# Patient Record
Sex: Male | Born: 1990 | Race: Black or African American | Hispanic: No | Marital: Single | Smoking: Never smoker
Health system: Southern US, Community
[De-identification: ages and names within clinical notes are randomized; demographics above are authoritative.]

## PROBLEM LIST (undated history)

## (undated) DIAGNOSIS — I10 Essential (primary) hypertension: Secondary | ICD-10-CM

## (undated) DIAGNOSIS — U071 COVID-19: Secondary | ICD-10-CM

---
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
## 2019-04-16 ENCOUNTER — Encounter (HOSPITAL_COMMUNITY): Payer: Self-pay | Admitting: Emergency Medicine

## 2019-04-16 ENCOUNTER — Emergency Department (HOSPITAL_COMMUNITY): Payer: Self-pay

## 2019-04-16 ENCOUNTER — Other Ambulatory Visit: Payer: Self-pay

## 2019-04-16 ENCOUNTER — Emergency Department (HOSPITAL_COMMUNITY)
Admission: EM | Admit: 2019-04-16 | Discharge: 2019-04-17 | Disposition: A | Discharge status: Home / Self Care | Payer: Self-pay | Attending: Emergency Medicine | Admitting: Emergency Medicine

## 2019-04-16 DIAGNOSIS — Z20822 Contact with and (suspected) exposure to covid-19: Secondary | ICD-10-CM

## 2019-04-16 DIAGNOSIS — U071 COVID-19: Secondary | ICD-10-CM | POA: Insufficient documentation

## 2019-04-16 LAB — CBC WITH DIFFERENTIAL/PLATELET
Abs Immature Granulocytes: 0.04 10*3/uL (ref 0.00–0.07)
Basophils Absolute: 0 10*3/uL (ref 0.0–0.1)
Basophils Relative: 0 %
Eosinophils Absolute: 0 10*3/uL (ref 0.0–0.5)
Eosinophils Relative: 0 %
HCT: 47.9 % (ref 39.0–52.0)
Hemoglobin: 16 g/dL (ref 13.0–17.0)
Immature Granulocytes: 1 %
Lymphocytes Relative: 9 %
Lymphs Abs: 0.8 10*3/uL (ref 0.7–4.0)
MCH: 31.3 pg (ref 26.0–34.0)
MCHC: 33.4 g/dL (ref 30.0–36.0)
MCV: 93.6 fL (ref 80.0–100.0)
Monocytes Absolute: 0.2 10*3/uL (ref 0.1–1.0)
Monocytes Relative: 2 %
Neutro Abs: 7.5 10*3/uL (ref 1.7–7.7)
Neutrophils Relative %: 88 %
Platelets: 173 10*3/uL (ref 150–400)
RBC: 5.12 MIL/uL (ref 4.22–5.81)
RDW: 12.8 % (ref 11.5–15.5)
WBC: 8.5 10*3/uL (ref 4.0–10.5)
nRBC: 0 % (ref 0.0–0.2)

## 2019-04-16 LAB — COMPREHENSIVE METABOLIC PANEL
ALT: 46 U/L — ABNORMAL HIGH (ref 0–44)
AST: 49 U/L — ABNORMAL HIGH (ref 15–41)
Albumin: 3.2 g/dL — ABNORMAL LOW (ref 3.5–5.0)
Alkaline Phosphatase: 61 U/L (ref 38–126)
Anion gap: 10 (ref 5–15)
BUN: 10 mg/dL (ref 6–20)
CO2: 22 mmol/L (ref 22–32)
Calcium: 8.6 mg/dL — ABNORMAL LOW (ref 8.9–10.3)
Chloride: 103 mmol/L (ref 98–111)
Creatinine, Ser: 1.46 mg/dL — ABNORMAL HIGH (ref 0.61–1.24)
GFR calc Af Amer: >60 mL/min (ref >60)
GFR calc non Af Amer: >60 mL/min (ref >60)
Glucose, Bld: 116 mg/dL — ABNORMAL HIGH (ref 70–99)
Potassium: 4 mmol/L (ref 3.5–5.1)
Sodium: 135 mmol/L (ref 135–145)
Total Bilirubin: 0.5 mg/dL (ref 0.3–1.2)
Total Protein: 7.3 g/dL (ref 6.5–8.1)

## 2019-04-16 LAB — URINALYSIS, ROUTINE W REFLEX MICROSCOPIC
Bacteria, UA: NONE SEEN
Bilirubin Urine: NEGATIVE
Glucose, UA: NEGATIVE mg/dL
Hgb urine dipstick: NEGATIVE
Ketones, ur: NEGATIVE mg/dL
Leukocytes,Ua: NEGATIVE
Nitrite: NEGATIVE
Protein, ur: >=300 mg/dL — AB
Specific Gravity, Urine: 1.04 — ABNORMAL HIGH (ref 1.005–1.030)
pH: 5 (ref 5.0–8.0)

## 2019-04-16 LAB — LIPASE, BLOOD: Lipase: 27 U/L (ref 11–51)

## 2019-04-16 LAB — LACTIC ACID, PLASMA: Lactic Acid, Venous: 1.1 mmol/L (ref 0.5–1.9)

## 2019-04-16 MED ORDER — SODIUM CHLORIDE 0.9% FLUSH: 3.0000 mL | Freq: Once | INTRAVENOUS | Status: DC

## 2019-04-16 MED ORDER — ALBUTEROL SULFATE HFA 108 (90 BASE) MCG/ACT IN AERS
2.0000 | INHALATION_SPRAY | Freq: Once | RESPIRATORY_TRACT | Status: AC
  Administered 2019-04-16: 2 via RESPIRATORY_TRACT
  Filled 2019-04-16: qty 6.7

## 2019-04-16 MED ORDER — ONDANSETRON HCL 4 MG/2ML IJ SOLN
4.0000 mg | Freq: Once | INTRAMUSCULAR | Status: AC
  Administered 2019-04-16: 4 mg via INTRAVENOUS
  Filled 2019-04-16: qty 2

## 2019-04-16 MED ORDER — ACETAMINOPHEN 325 MG PO TABS: 650.0000 mg | ORAL_TABLET | Freq: Once | ORAL | Status: DC | PRN

## 2019-04-16 MED ORDER — SODIUM CHLORIDE 0.9 % IV BOLUS
1000.0000 mL | Freq: Once | INTRAVENOUS | Status: AC
  Administered 2019-04-16: 20:00:00 1000 mL via INTRAVENOUS

## 2019-04-16 MED ORDER — ACETAMINOPHEN 500 MG PO TABS
1000.0000 mg | ORAL_TABLET | Freq: Once | ORAL | Status: AC
  Administered 2019-04-16: 1000 mg via ORAL
  Filled 2019-04-16: qty 2

## 2019-04-16 NOTE — ED Triage Notes (Addendum)
C/o fever, SOB, chest pain, non-productive cough, headache, and vomiting x 5 days.  Denies known exposure to COVID.  Temp 102.7 pt to treatment room.

## 2019-04-16 NOTE — ED Provider Notes (Signed)
[facility] EMER[facility]ENCY DEPARTMENT Provider Note   CSN: [id number] Arrival date & time: 04/16/19  1833     History   Chief Complaint Chief Complaint  Patient presents with  . Fever  . Cough  . Emesis    HPI Craig Silva is a 28 y.o. male.     HPI   28 year old male with history of hypertension presenting to the ED for evaluation of fever, rhinorrhea, nonproductive cough, nausea, vomiting, diarrhea, headache that began about 5 days ago.  He also reports chest wall pain with cough only.  He has had some dyspnea on exertion but no shortness of breath at rest.  Denies pain with inspiration.  Denies abdominal pain.  He states he was recently around his neighbor who was diagnosed with the coronavirus last week.  History reviewed. No pertinent past medical history.  There are no active problems to display for this patient.   History reviewed. No pertinent surgical history.      Home Medications    Prior to Admission medications   Medication Sig Start Date End Date Taking? Authorizing Provider  diphenhydramine-acetaminophen (TYLENOL PM EXTRA STRENGTH) 25-500 MG TABS tablet Take 2-3 tablets by mouth at bedtime as needed (pain/fever/cough/sleep).   Yes [provider]  DM-Doxylamine-Acetaminophen (NIGHTTIME COLD/FLU RELIEF PO) Take 2 tablets by mouth at bedtime as needed (cough/congestion/fever).   Yes [provider]  Pseudoephedrine-APAP-DM (DAYTIME COLD/FLU PO) Take 1-2 tablets by mouth every 6 (six) hours as needed (cough/congestion/fever).   Yes [provider]  benzonatate (TESSALON) 100 MG capsule Take 1 capsule (100 mg total) by mouth every 8 (eight) hours for 5 days. 04/17/19 04/22/19  Vane Yapp S, PA-C  ondansetron (ZOFRAN) 4 MG tablet Take 1 tablet (4 mg total) by mouth every 6 (six) hours. 04/17/19   Chanon Loney S, PA-C    Family History No family history on file.  Social History Social History   Tobacco  Use  . Smoking status: Never Smoker  . Smokeless tobacco: Never Used  Substance Use Topics  . Alcohol use: Not Currently  . Drug use: Never     Allergies   Patient has no known allergies.   Review of Systems Review of Systems  Constitutional: Positive for chills, diaphoresis and fever.  HENT: Positive for rhinorrhea. Negative for ear pain and sore throat.   Eyes: Negative for visual disturbance.  Respiratory: Positive for cough. Negative for shortness of breath.   Cardiovascular: Positive for chest pain (chest wall pain with cough only).  Gastrointestinal: Positive for diarrhea, nausea and vomiting. Negative for abdominal pain.  Genitourinary: Negative for dysuria and hematuria.  Musculoskeletal: Negative for back pain.  Skin: Negative for rash.  Neurological: Positive for headaches.  All other systems reviewed and are negative.    Physical Exam Updated Vital Signs BP 125/84   Pulse (!) 101   Temp (!) 102.7 F (39.3 C) (Oral)   Resp 19   SpO2 94%   Physical Exam Vitals signs and nursing note reviewed.  Constitutional:      Appearance: He is well-developed. He is diaphoretic.  HENT:     Head: Normocephalic and atraumatic.     Mouth/Throat:     Mouth: Mucous membranes are dry.     Pharynx: No oropharyngeal exudate or posterior oropharyngeal erythema.  Eyes:     Conjunctiva/sclera: Conjunctivae normal.  Neck:     Musculoskeletal: Neck supple.  Cardiovascular:     Rate and Rhythm: Regular rhythm. Tachycardia present.  Heart sounds: No murmur.  Pulmonary:     Effort: Pulmonary effort is normal. No respiratory distress.     Breath sounds: Normal breath sounds. No wheezing, rhonchi or rales.  Abdominal:     General: Bowel sounds are normal. There is no distension.     Palpations: Abdomen is soft.     Tenderness: There is no abdominal tenderness. There is no guarding or rebound.  Skin:    General: Skin is warm.  Neurological:     Mental Status: He is alert.       ED Treatments / Results  Labs (all labs ordered are listed, but only abnormal results are displayed) Labs Reviewed  COMPREHENSIVE METABOLIC PANEL - Abnormal; Notable for the following components:      Result Value   Glucose, Bld 116 (*)    Creatinine, Ser 1.46 (*)    Calcium 8.6 (*)    Albumin 3.2 (*)    AST 49 (*)    ALT 46 (*)    All other components within normal limits  URINALYSIS, ROUTINE W REFLEX MICROSCOPIC - Abnormal; Notable for the following components:   Color, Urine AMBER (*)    APPearance HAZY (*)    Specific Gravity, Urine 1.040 (*)    Protein, ur >=300 (*)    All other components within normal limits  SARS CORONAVIRUS 2 (TAT 6-24 HRS)  LACTIC ACID, PLASMA  CBC WITH DIFFERENTIAL/PLATELET  LIPASE, BLOOD  LACTIC ACID, PLASMA    EKG EKG Interpretation  Date/Time:  Saturday April 16 2019 18:37:05 EST Ventricular Rate:  124 PR Interval:  122 QRS Duration: 84 QT Interval:  314 QTC Calculation: 451 R Axis:   147 Text Interpretation: Sinus tachycardia Right axis deviation Possible Right ventricular hypertrophy ST & T wave abnormality, consider inferior ischemia Abnormal ECG No previous ECGs available Confirmed by Kennis CarinaBero, Michael [phone]([id number]) on 04/16/2019 8:30:47 PM   Radiology Dg Chest Portable 1 View  Result Date: 04/16/2019 CLINICAL DATA:  Shortness of breath and fever EXAM: PORTABLE CHEST 1 VIEW COMPARISON:  None. FINDINGS: Cardiac shadow is at the upper limits of normal in size but accentuated by the portable technique. The lungs are well aerated bilaterally. No focal infiltrate or sizable effusion is seen. No bony abnormality is noted. IMPRESSION: No active disease. Electronically Signed   By: Alcide CleverMark  Lukens M.D.   On: 04/16/2019 19:25    Procedures Procedures (including critical care time)  Medications Ordered in ED Medications  sodium chloride flush (NS) 0.9 % injection 3 mL (has no administration in time range)  acetaminophen (TYLENOL) tablet 650  mg (has no administration in time range)  acetaminophen (TYLENOL) tablet 1,000 mg (1,000 mg Oral Given 04/16/19 1932)  sodium chloride 0.9 % bolus 1,000 mL (1,000 mLs Intravenous New Bag/Given 04/16/19 1930)  ondansetron (ZOFRAN) injection 4 mg (4 mg Intravenous Given 04/16/19 1934)  albuterol (VENTOLIN HFA) 108 (90 Base) MCG/ACT inhaler 2 puff (2 puffs Inhalation Given 04/16/19 1933)     Initial Impression / Assessment and Plan / ED Course  I have reviewed the triage vital signs and the nursing notes.  Pertinent labs & imaging results that were available during my care of the patient were reviewed by me and considered in my medical decision making (see chart for details).     Final Clinical Impressions(s) / ED Diagnoses   Final diagnoses:  Suspected COVID-19 virus infection   28 year old male with history of hypertension presenting to the ED for evaluation of fever, rhinorrhea, nonproductive cough, nausea,  vomiting, diarrhea, headache that began about 5 days ago.  He also reports chest wall pain with cough only.  He has had some dyspnea on exertion but no shortness of breath at rest.  Denies pain with inspiration.  Denies abdominal pain.  He states he was recently around his neighbor who was diagnosed with the coronavirus last week.  CBC wnl CMP w/o significant electryolyte derangement, cr slightly elevated, no recent prior to compare, may be prerenal, hydrated in ED. AST/ALT slightly elevated, would expect with covid infection Lipase wnl UA with proteinuria, no UTI Lactic acid wnl COVID pending at discharge, high suspicion, advised to quarantine.   EKG Sinus tachycardia Right axis deviation Possible Right ventricular hypertrophy ST & T wave abnormality, consider inferior ischemia Abnormal ECG No previous ECGs available - no cp, doubt acs  CXR  No active disease  Pt give IVF ,antiemetics, tylenol. On reassessment patient states he feels improved after fluids and the inhaler.  His  heart rate has improved.  He satting at 96 to 98% on room air.  He does not feel short of breath.  He denies any chest pain or pleuritic pain.  I discussed work-up and plan for follow-up.  Covid test still pending but I do have high suspicion for this.  Advised on quarantine measures.  Given information follow-up with PCP.  Advised on return precautions.  He voices understanding of the plan and reasons to return.  All questions answered.  Patient here for discharge. ---  Craig Silva was evaluated in Emergency Department on 04/17/2019 for the symptoms described in the history of present illness. He was evaluated in the context of the global COVID-19 pandemic, which necessitated consideration that the patient might be at risk for infection with the SARS-CoV-2 virus that causes COVID-19. Institutional protocols and algorithms that pertain to the evaluation of patients at risk for COVID-19 are in a state of rapid change based on information released by regulatory bodies including the CDC and federal and state organizations. These policies and algorithms were followed during the patient's care in the ED.   ED Discharge Orders         Ordered    benzonatate (TESSALON) 100 MG capsule  Every 8 hours     04/17/19 0007    ondansetron (ZOFRAN) 4 MG tablet  Every 6 hours     04/17/19 0007           Karrie Meres, PA-C 04/17/19 0010    Sabas Sous, MD 04/17/19 [phone]

## 2019-04-17 LAB — SARS CORONAVIRUS 2 (TAT 6-24 HRS): SARS Coronavirus 2: POSITIVE — AB

## 2019-04-17 MED ORDER — ONDANSETRON HCL 4 MG PO TABS: 4.0000 mg | ORAL_TABLET | Freq: Four times a day (QID) | ORAL | 0 refills | Status: DC

## 2019-04-17 MED ORDER — BENZONATATE 100 MG PO CAPS: 100.0000 mg | ORAL_CAPSULE | Freq: Three times a day (TID) | ORAL | 0 refills | Status: AC

## 2019-04-17 NOTE — Discharge Instructions (Signed)
Take two puffs of the albuterol inhaler every 4 hours as needed for cough, shortness of breath, or wheezing.   Today you were were tested for the coronavirus.  The results will be available in the next 3 to 5 days.  If the results are positive the hospital will contact you.  If they are negative the hospital would not contact you.  You will need to self quarantine until you are aware of your results.  If they are positive you will need to self quarantine as directed below.  You should be isolated for at least 7 days since the onset of your symptoms AND >72 hours after symptoms resolution (absence of fever without the use of fever reducing medication and improvement in respiratory symptoms), whichever is longer

## 2019-04-17 NOTE — ED Notes (Signed)
Patient verbalizes understanding of discharge instructions. Opportunity for questioning and answers were provided. Armband removed by staff, pt discharged from ED.  

## 2019-05-10 ENCOUNTER — Encounter (HOSPITAL_COMMUNITY): Payer: Self-pay | Admitting: Emergency Medicine

## 2019-05-10 ENCOUNTER — Emergency Department (HOSPITAL_COMMUNITY)
Admission: EM | Admit: 2019-05-10 | Discharge: 2019-05-11 | Disposition: A | Discharge status: Home / Self Care | Payer: Self-pay | Attending: Emergency Medicine | Admitting: Emergency Medicine

## 2019-05-10 ENCOUNTER — Other Ambulatory Visit: Payer: Self-pay

## 2019-05-10 DIAGNOSIS — Z5321 Procedure and treatment not carried out due to patient leaving prior to being seen by health care provider: Secondary | ICD-10-CM | POA: Insufficient documentation

## 2019-05-10 DIAGNOSIS — I10 Essential (primary) hypertension: Secondary | ICD-10-CM | POA: Insufficient documentation

## 2019-05-10 HISTORY — DX: Essential (primary) hypertension: I10

## 2019-05-10 HISTORY — DX: COVID-19: U07.1

## 2019-05-10 LAB — COMPREHENSIVE METABOLIC PANEL
ALT: 66 U/L — ABNORMAL HIGH (ref 0–44)
AST: 60 U/L — ABNORMAL HIGH (ref 15–41)
Albumin: 3.8 g/dL (ref 3.5–5.0)
Alkaline Phosphatase: 67 U/L (ref 38–126)
Anion gap: 15 (ref 5–15)
BUN: 6 mg/dL (ref 6–20)
CO2: 23 mmol/L (ref 22–32)
Calcium: 9.2 mg/dL (ref 8.9–10.3)
Chloride: 98 mmol/L (ref 98–111)
Creatinine, Ser: 1.07 mg/dL (ref 0.61–1.24)
GFR calc Af Amer: >60 mL/min (ref >60)
GFR calc non Af Amer: >60 mL/min (ref >60)
Glucose, Bld: 144 mg/dL — ABNORMAL HIGH (ref 70–99)
Potassium: 3.7 mmol/L (ref 3.5–5.1)
Sodium: 136 mmol/L (ref 135–145)
Total Bilirubin: 1 mg/dL (ref 0.3–1.2)
Total Protein: 8.5 g/dL — ABNORMAL HIGH (ref 6.5–8.1)

## 2019-05-10 LAB — URINALYSIS, ROUTINE W REFLEX MICROSCOPIC
Bacteria, UA: NONE SEEN
Bilirubin Urine: NEGATIVE
Glucose, UA: 50 mg/dL — AB
Hgb urine dipstick: NEGATIVE
Ketones, ur: 20 mg/dL — AB
Leukocytes,Ua: NEGATIVE
Nitrite: NEGATIVE
Protein, ur: 100 mg/dL — AB
Specific Gravity, Urine: 1.027 (ref 1.005–1.030)
pH: 7 (ref 5.0–8.0)

## 2019-05-10 LAB — CBC WITH DIFFERENTIAL/PLATELET
Abs Immature Granulocytes: 0.02 10*3/uL (ref 0.00–0.07)
Basophils Absolute: 0 10*3/uL (ref 0.0–0.1)
Basophils Relative: 1 %
Eosinophils Absolute: 0 10*3/uL (ref 0.0–0.5)
Eosinophils Relative: 1 %
HCT: 48.8 % (ref 39.0–52.0)
Hemoglobin: 15.9 g/dL (ref 13.0–17.0)
Immature Granulocytes: 0 %
Lymphocytes Relative: 23 %
Lymphs Abs: 1.4 10*3/uL (ref 0.7–4.0)
MCH: 30.8 pg (ref 26.0–34.0)
MCHC: 32.6 g/dL (ref 30.0–36.0)
MCV: 94.6 fL (ref 80.0–100.0)
Monocytes Absolute: 0.5 10*3/uL (ref 0.1–1.0)
Monocytes Relative: 7 %
Neutro Abs: 4.3 10*3/uL (ref 1.7–7.7)
Neutrophils Relative %: 68 %
Platelets: 216 10*3/uL (ref 150–400)
RBC: 5.16 MIL/uL (ref 4.22–5.81)
RDW: 13.7 % (ref 11.5–15.5)
WBC: 6.3 10*3/uL (ref 4.0–10.5)
nRBC: 0 % (ref 0.0–0.2)

## 2019-05-10 NOTE — ED Triage Notes (Signed)
Pt to ED via EMS with c/o hypertension.  Pt st's he was laying down and started tingling all over.  Pt st's he has been dx with HTN but does not have a MD to follow up with.  Pt is not on any HTN meds at this time

## 2019-05-11 NOTE — ED Notes (Signed)
Charge/triage rn notified about his bp. States he should be next to go back

## 2019-05-11 NOTE — ED Notes (Signed)
Another patient states this patient got up and walked out. Not visible in lobby and not answering to name call for vital recheck.

## 2019-05-27 ENCOUNTER — Encounter (HOSPITAL_COMMUNITY): Payer: Self-pay | Admitting: Emergency Medicine

## 2019-05-27 ENCOUNTER — Observation Stay (HOSPITAL_COMMUNITY)
Admission: EM | Admit: 2019-05-27 | Discharge: 2019-05-28 | Disposition: A | Discharge status: Home / Self Care | Payer: Self-pay | Attending: Internal Medicine | Admitting: Internal Medicine

## 2019-05-27 ENCOUNTER — Emergency Department (HOSPITAL_COMMUNITY): Payer: Self-pay

## 2019-05-27 ENCOUNTER — Other Ambulatory Visit: Payer: Self-pay

## 2019-05-27 DIAGNOSIS — R809 Proteinuria, unspecified: Secondary | ICD-10-CM | POA: Insufficient documentation

## 2019-05-27 DIAGNOSIS — R0789 Other chest pain: Secondary | ICD-10-CM | POA: Insufficient documentation

## 2019-05-27 DIAGNOSIS — E669 Obesity, unspecified: Secondary | ICD-10-CM | POA: Insufficient documentation

## 2019-05-27 DIAGNOSIS — N179 Acute kidney failure, unspecified: Secondary | ICD-10-CM | POA: Insufficient documentation

## 2019-05-27 DIAGNOSIS — Z6835 Body mass index (BMI) 35.0-35.9, adult: Secondary | ICD-10-CM | POA: Insufficient documentation

## 2019-05-27 DIAGNOSIS — R079 Chest pain, unspecified: Secondary | ICD-10-CM | POA: Diagnosis present

## 2019-05-27 DIAGNOSIS — I160 Hypertensive urgency: Secondary | ICD-10-CM

## 2019-05-27 DIAGNOSIS — R030 Elevated blood-pressure reading, without diagnosis of hypertension: Secondary | ICD-10-CM | POA: Diagnosis present

## 2019-05-27 DIAGNOSIS — I10 Essential (primary) hypertension: Principal | ICD-10-CM | POA: Insufficient documentation

## 2019-05-27 DIAGNOSIS — Z20828 Contact with and (suspected) exposure to other viral communicable diseases: Secondary | ICD-10-CM | POA: Insufficient documentation

## 2019-05-27 DIAGNOSIS — F1910 Other psychoactive substance abuse, uncomplicated: Secondary | ICD-10-CM

## 2019-05-27 DIAGNOSIS — Z8619 Personal history of other infectious and parasitic diseases: Secondary | ICD-10-CM | POA: Insufficient documentation

## 2019-05-27 LAB — TROPONIN I (HIGH SENSITIVITY)
Troponin I (High Sensitivity): 21 ng/L — ABNORMAL HIGH (ref <18)
Troponin I (High Sensitivity): 16 ng/L (ref <18)

## 2019-05-27 LAB — BASIC METABOLIC PANEL
Anion gap: 12 (ref 5–15)
BUN: 11 mg/dL (ref 6–20)
CO2: 24 mmol/L (ref 22–32)
Calcium: 9.1 mg/dL (ref 8.9–10.3)
Chloride: 101 mmol/L (ref 98–111)
Creatinine, Ser: 1.3 mg/dL — ABNORMAL HIGH (ref 0.61–1.24)
GFR calc Af Amer: >60 mL/min (ref >60)
GFR calc non Af Amer: >60 mL/min (ref >60)
Glucose, Bld: 121 mg/dL — ABNORMAL HIGH (ref 70–99)
Potassium: 4.1 mmol/L (ref 3.5–5.1)
Sodium: 137 mmol/L (ref 135–145)

## 2019-05-27 LAB — CBC
HCT: 48.2 % (ref 39.0–52.0)
Hemoglobin: 15.9 g/dL (ref 13.0–17.0)
MCH: 30.9 pg (ref 26.0–34.0)
MCHC: 33 g/dL (ref 30.0–36.0)
MCV: 93.6 fL (ref 80.0–100.0)
Platelets: 327 10*3/uL (ref 150–400)
RBC: 5.15 MIL/uL (ref 4.22–5.81)
RDW: 13.8 % (ref 11.5–15.5)
WBC: 7.6 10*3/uL (ref 4.0–10.5)
nRBC: 0 % (ref 0.0–0.2)

## 2019-05-27 MED ORDER — SODIUM CHLORIDE 0.9 % IV BOLUS (SEPSIS)
1000.0000 mL | Freq: Once | INTRAVENOUS | Status: AC
  Administered 2019-05-28: 1000 mL via INTRAVENOUS

## 2019-05-27 MED ORDER — SODIUM CHLORIDE 0.9% FLUSH: 3.0000 mL | Freq: Once | INTRAVENOUS | Status: DC

## 2019-05-27 MED ORDER — MORPHINE SULFATE (PF) 4 MG/ML IV SOLN
4.0000 mg | Freq: Once | INTRAVENOUS | Status: AC
  Administered 2019-05-28: 4 mg via INTRAVENOUS
  Filled 2019-05-27: qty 1

## 2019-05-27 MED ORDER — KETOROLAC TROMETHAMINE 30 MG/ML IJ SOLN: 30.0000 mg | Freq: Once | INTRAMUSCULAR | Status: DC

## 2019-05-27 MED ORDER — ONDANSETRON HCL 4 MG/2ML IJ SOLN
4.0000 mg | Freq: Once | INTRAMUSCULAR | Status: AC
  Administered 2019-05-28: 4 mg via INTRAVENOUS
  Filled 2019-05-27: qty 2

## 2019-05-27 MED ORDER — LABETALOL HCL 5 MG/ML IV SOLN
10.0000 mg | Freq: Once | INTRAVENOUS | Status: AC
  Administered 2019-05-28: 10 mg via INTRAVENOUS
  Filled 2019-05-27: qty 4

## 2019-05-27 NOTE — ED Triage Notes (Signed)
Reports central cp that started today.  Had a hx of the same a few weeks ago but didn't wait to be seen.

## 2019-05-28 ENCOUNTER — Encounter (HOSPITAL_COMMUNITY): Payer: Self-pay | Admitting: Internal Medicine

## 2019-05-28 DIAGNOSIS — R079 Chest pain, unspecified: Secondary | ICD-10-CM | POA: Diagnosis present

## 2019-05-28 DIAGNOSIS — R030 Elevated blood-pressure reading, without diagnosis of hypertension: Secondary | ICD-10-CM | POA: Diagnosis present

## 2019-05-28 LAB — COMPREHENSIVE METABOLIC PANEL
ALT: 39 U/L (ref 0–44)
AST: 39 U/L (ref 15–41)
Albumin: 3.6 g/dL (ref 3.5–5.0)
Alkaline Phosphatase: 60 U/L (ref 38–126)
Anion gap: 13 (ref 5–15)
BUN: 11 mg/dL (ref 6–20)
CO2: 24 mmol/L (ref 22–32)
Calcium: 8.5 mg/dL — ABNORMAL LOW (ref 8.9–10.3)
Chloride: 102 mmol/L (ref 98–111)
Creatinine, Ser: 1.3 mg/dL — ABNORMAL HIGH (ref 0.61–1.24)
GFR calc Af Amer: >60 mL/min (ref >60)
GFR calc non Af Amer: >60 mL/min (ref >60)
Glucose, Bld: 151 mg/dL — ABNORMAL HIGH (ref 70–99)
Potassium: 3.6 mmol/L (ref 3.5–5.1)
Sodium: 139 mmol/L (ref 135–145)
Total Bilirubin: 0.4 mg/dL (ref 0.3–1.2)
Total Protein: 7.2 g/dL (ref 6.5–8.1)

## 2019-05-28 LAB — URINALYSIS, ROUTINE W REFLEX MICROSCOPIC
Bacteria, UA: NONE SEEN
Bilirubin Urine: NEGATIVE
Glucose, UA: NEGATIVE mg/dL
Hgb urine dipstick: NEGATIVE
Ketones, ur: NEGATIVE mg/dL
Leukocytes,Ua: NEGATIVE
Nitrite: NEGATIVE
Protein, ur: >=300 mg/dL — AB
Specific Gravity, Urine: 1.019 (ref 1.005–1.030)
pH: 6 (ref 5.0–8.0)

## 2019-05-28 LAB — RAPID URINE DRUG SCREEN, HOSP PERFORMED
Amphetamines: NOT DETECTED
Barbiturates: NOT DETECTED
Benzodiazepines: NOT DETECTED
Cocaine: POSITIVE — AB
Opiates: POSITIVE — AB
Tetrahydrocannabinol: NOT DETECTED

## 2019-05-28 LAB — CBC
HCT: 45.8 % (ref 39.0–52.0)
Hemoglobin: 15.1 g/dL (ref 13.0–17.0)
MCH: 31.1 pg (ref 26.0–34.0)
MCHC: 33 g/dL (ref 30.0–36.0)
MCV: 94.2 fL (ref 80.0–100.0)
Platelets: 252 10*3/uL (ref 150–400)
RBC: 4.86 MIL/uL (ref 4.22–5.81)
RDW: 13.8 % (ref 11.5–15.5)
WBC: 6.1 10*3/uL (ref 4.0–10.5)
nRBC: 0 % (ref 0.0–0.2)

## 2019-05-28 LAB — TROPONIN I (HIGH SENSITIVITY)
Troponin I (High Sensitivity): 16 ng/L (ref <18)
Troponin I (High Sensitivity): 17 ng/L (ref <18)

## 2019-05-28 LAB — HIV ANTIBODY (ROUTINE TESTING W REFLEX): HIV Screen 4th Generation wRfx: NONREACTIVE

## 2019-05-28 LAB — SARS CORONAVIRUS 2 (TAT 6-24 HRS): SARS Coronavirus 2: NEGATIVE

## 2019-05-28 LAB — MAGNESIUM: Magnesium: 1.6 mg/dL — ABNORMAL LOW (ref 1.7–2.4)

## 2019-05-28 LAB — PHOSPHORUS: Phosphorus: 2.9 mg/dL (ref 2.5–4.6)

## 2019-05-28 MED ORDER — THIAMINE HCL 100 MG PO TABS: 100.0000 mg | ORAL_TABLET | Freq: Every day | ORAL | Status: DC

## 2019-05-28 MED ORDER — LORAZEPAM 1 MG PO TABS: 0.0000 mg | ORAL_TABLET | Freq: Two times a day (BID) | ORAL | Status: DC

## 2019-05-28 MED ORDER — ACETAMINOPHEN 650 MG RE SUPP: 650.0000 mg | Freq: Four times a day (QID) | RECTAL | Status: DC | PRN

## 2019-05-28 MED ORDER — LORAZEPAM 1 MG PO TABS: 1.0000 mg | ORAL_TABLET | ORAL | Status: DC | PRN

## 2019-05-28 MED ORDER — ASPIRIN EC 81 MG PO TBEC
81.0000 mg | DELAYED_RELEASE_TABLET | Freq: Every day | ORAL | Status: DC
  Administered 2019-05-28: 81 mg via ORAL
  Filled 2019-05-28: qty 1

## 2019-05-28 MED ORDER — ONDANSETRON HCL 4 MG/2ML IJ SOLN: 4.0000 mg | Freq: Four times a day (QID) | INTRAMUSCULAR | Status: DC | PRN

## 2019-05-28 MED ORDER — FOLIC ACID 1 MG PO TABS: 1.0000 mg | ORAL_TABLET | Freq: Every day | ORAL | Status: DC

## 2019-05-28 MED ORDER — ADULT MULTIVITAMIN W/MINERALS CH: 1.0000 | ORAL_TABLET | Freq: Every day | ORAL | Status: DC

## 2019-05-28 MED ORDER — THIAMINE HCL 100 MG PO TABS: 100.0000 mg | ORAL_TABLET | Freq: Every day | ORAL | 0 refills | Status: DC

## 2019-05-28 MED ORDER — NITROGLYCERIN 0.4 MG SL SUBL: 0.4000 mg | SUBLINGUAL_TABLET | SUBLINGUAL | Status: DC | PRN

## 2019-05-28 MED ORDER — THIAMINE HCL 100 MG/ML IJ SOLN: 100.0000 mg | Freq: Every day | INTRAMUSCULAR | Status: DC

## 2019-05-28 MED ORDER — ONDANSETRON HCL 4 MG PO TABS: 4.0000 mg | ORAL_TABLET | Freq: Four times a day (QID) | ORAL | Status: DC | PRN

## 2019-05-28 MED ORDER — HYDRALAZINE HCL 20 MG/ML IJ SOLN
5.0000 mg | Freq: Once | INTRAMUSCULAR | Status: AC
  Administered 2019-05-28: 5 mg via INTRAVENOUS
  Filled 2019-05-28: qty 1

## 2019-05-28 MED ORDER — LORAZEPAM 1 MG PO TABS: 0.0000 mg | ORAL_TABLET | Freq: Four times a day (QID) | ORAL | Status: DC

## 2019-05-28 MED ORDER — ENOXAPARIN SODIUM 40 MG/0.4ML ~~LOC~~ SOLN: 40.0000 mg | SUBCUTANEOUS | Status: DC

## 2019-05-28 MED ORDER — AMLODIPINE BESYLATE 5 MG PO TABS
5.0000 mg | ORAL_TABLET | Freq: Once | ORAL | Status: AC
  Administered 2019-05-28: 5 mg via ORAL
  Filled 2019-05-28: qty 1

## 2019-05-28 MED ORDER — LORAZEPAM 2 MG/ML IJ SOLN
1.0000 mg | Freq: Once | INTRAMUSCULAR | Status: AC
  Administered 2019-05-28: 05:00:00 1 mg via INTRAVENOUS
  Filled 2019-05-28: qty 1

## 2019-05-28 MED ORDER — AMLODIPINE BESYLATE 5 MG PO TABS: 5.0000 mg | ORAL_TABLET | Freq: Every day | ORAL | 0 refills | Status: DC

## 2019-05-28 MED ORDER — ASPIRIN 81 MG PO TBEC: 81.0000 mg | DELAYED_RELEASE_TABLET | Freq: Every day | ORAL | 0 refills | Status: AC

## 2019-05-28 MED ORDER — ACETAMINOPHEN 325 MG PO TABS: 650.0000 mg | ORAL_TABLET | Freq: Four times a day (QID) | ORAL | Status: DC | PRN

## 2019-05-28 MED ORDER — MAGNESIUM SULFATE 2 GM/50ML IV SOLN
2.0000 g | Freq: Once | INTRAVENOUS | Status: AC
  Administered 2019-05-28: 07:00:00 2 g via INTRAVENOUS
  Filled 2019-05-28: qty 50

## 2019-05-28 MED ORDER — LORAZEPAM 2 MG/ML IJ SOLN
1.0000 mg | INTRAMUSCULAR | Status: DC | PRN
  Administered 2019-05-28: 1 mg via INTRAVENOUS
  Filled 2019-05-28: qty 1

## 2019-05-28 NOTE — ED Notes (Signed)
Pt reports he drinks everyday and has experienced DTs. Pt states he drinks approximately 2-4 beers and several shots of liquor.

## 2019-05-28 NOTE — H&P (Signed)
History and Physical    PAT SIRES CWC:[id number] DOB: 05/07/1991 DOA: 05/27/2019  PCP: Patient, No Pcp Per  Patient coming from: Home.  Chief Complaint: Chest pain.  HPI: Craig Silva is a 28 y.o. male with history of cocaine and alcohol abuse drinks alcohol every day and used cocaine 2 days ago with recent diagnosis of COVID-19 about 2 months ago presents to the ER because of persistent chest pain off and on for last 3 to 4 weeks.  Patient states his chest pain got more acutely worsened last evening around 2 PM.  Denies any fever chills productive cough shortness of breath nausea vomiting abdominal pain or diarrhea.  Patient states she had come to the ER 2 weeks ago with chest pain but left before he could be seen.  Patient chest pain is mostly in the left anterior chest wall and sometimes radiating to the neck and.  Present even at rest.  ED Course: In the ER patient's blood pressure is found to be markedly elevated medication showing sinus tachycardia creatinine 1.3 urine showing proteinuria urine drug screen positive for cocaine and opiates.  COVID-19 test is pending.  High sensitive troponin was 21 and 16.  Patient was given amlodipine labetalol despite which his blood pressure was still elevated admitted for chest pain and elevated blood pressure.  Review of Systems: As per HPI, rest all negative.   Past Medical History:  Diagnosis Date  . COVID-19   . Hypertension     History reviewed. No pertinent surgical history.   reports that he has never smoked. He has never used smokeless tobacco. He reports current alcohol use. He reports current drug use. Drug: Cocaine.  No Known Allergies  Family History  Problem Relation Age of Onset  . CAD Neg Hx   . Diabetes Mellitus II Neg Hx     Prior to Admission medications   Medication Sig Start Date End Date Taking? Authorizing Provider  ondansetron (ZOFRAN) 4 MG tablet Take 1 tablet (4 mg total) by mouth every 6 (six) hours.  Patient not taking: Reported on 05/28/2019 04/17/19   Rodney Booze, PA-C    Physical Exam: Constitutional: Moderately built and nourished. Vitals:   05/28/19 0244 05/28/19 0247 05/28/19 0345 05/28/19 0351  BP: (!) 172/112 (!) 174/118 (!) 166/142 (!) 166/142  Pulse:   93   Resp:   19   Temp:      TempSrc:      SpO2:   96%   Weight:      Height:       Eyes: Anicteric no pallor. ENMT: No discharge from the ears eyes nose or mouth. Neck: No mass felt.  No neck rigidity. Respiratory: No rhonchi or crepitations. Cardiovascular: S1-S2 heard. Abdomen: Soft nontender bowel sounds present. Musculoskeletal: No edema.  No joint effusion. Skin: No rash. Neurologic: Alert awake oriented to time place and person.  Moves all extremities. Psychiatric: Appears normal per normal affect.   Labs on Admission: I have personally reviewed following labs and imaging studies  CBC: Recent Labs  Lab 05/27/19 1536  WBC 7.6  HGB 15.9  HCT 48.2  MCV 93.6  PLT 761   Basic Metabolic Panel: Recent Labs  Lab 05/27/19 1536  NA 137  K 4.1  CL 101  CO2 24  GLUCOSE 121*  BUN 11  CREATININE 1.30*  CALCIUM 9.1   GFR: Estimated Creatinine Clearance: 91.9 mL/min (A) (by C-G formula based on SCr of 1.3 mg/dL (H)). Liver Function  Tests: No results for input(s): AST, ALT, ALKPHOS, BILITOT, PROT, ALBUMIN in the last 168 hours. No results for input(s): LIPASE, AMYLASE in the last 168 hours. No results for input(s): AMMONIA in the last 168 hours. Coagulation Profile: No results for input(s): INR, PROTIME in the last 168 hours. Cardiac Enzymes: No results for input(s): CKTOTAL, CKMB, CKMBINDEX, TROPONINI in the last 168 hours. BNP (last 3 results) No results for input(s): PROBNP in the last 8760 hours. HbA1C: No results for input(s): HGBA1C in the last 72 hours. CBG: No results for input(s): GLUCAP in the last 168 hours. Lipid Profile: No results for input(s): CHOL, HDL, LDLCALC, TRIG,  CHOLHDL, LDLDIRECT in the last 72 hours. Thyroid Function Tests: No results for input(s): TSH, T4TOTAL, FREET4, T3FREE, THYROIDAB in the last 72 hours. Anemia Panel: No results for input(s): VITAMINB12, FOLATE, FERRITIN, TIBC, IRON, RETICCTPCT in the last 72 hours. Urine analysis:    Component Value Date/Time   COLORURINE YELLOW 05/28/2019 0239   APPEARANCEUR CLEAR 05/28/2019 0239   LABSPEC 1.019 05/28/2019 0239   PHURINE 6.0 05/28/2019 0239   GLUCOSEU NEGATIVE 05/28/2019 0239   HGBUR NEGATIVE 05/28/2019 0239   BILIRUBINUR NEGATIVE 05/28/2019 0239   KETONESUR NEGATIVE 05/28/2019 0239   PROTEINUR >=300 (A) 05/28/2019 0239   NITRITE NEGATIVE 05/28/2019 0239   LEUKOCYTESUR NEGATIVE 05/28/2019 0239   Sepsis Labs: @LABRCNTIP (procalcitonin:4,lacticidven:4) )No results found for this or any previous visit (from the past 240 hour(s)).   Radiological Exams on Admission: DG Chest 2 View  Result Date: 05/27/2019 CLINICAL DATA:  Central chest pain EXAM: CHEST - 2 VIEW COMPARISON:  04/16/2019 FINDINGS: Low volume film with mild asymmetric elevation right hemidiaphragm. The lungs are clear without focal pneumonia, edema, pneumothorax or pleural effusion. The cardiopericardial silhouette is within normal limits for size. The visualized bony structures of the thorax are intact. IMPRESSION: No active cardiopulmonary disease. Electronically Signed   By: 04/18/2019 M.D.   On: 05/27/2019 15:50    EKG: Independently reviewed.  Sinus tachycardia.  Assessment/Plan Principal Problem:   Chest pain Active Problems:   Elevated blood pressure reading    1. Chest pain in the setting of using cocaine with elevated blood pressure.  Cycle cardiac markers keep patient on Ativan as needed nitroglycerin as needed and closely monitor intermittently. 2. Elevated blood pressure -we will keep patient on as needed IV Dilaudid and Ativan for withdrawal protocol and follow blood pressure trends. 3. Acute renal  failure could be having a chronic complaint given that patient's urine does show proteinuria.  Follow metabolic panel closely and blood pressure trends. 4. Cocaine and alcohol abuse placed on CIWA protocol advised to quit. 5. Recent Covid test was positive.  Covid test presently is pending.  No symptoms of Covid at this time.  Covid test is pending.   DVT prophylaxis: Lovenox. Code Status: Full code. Family Communication: Discussed with patient. Disposition Plan: Home. Consults called: None. Admission status: Observation.   05/29/2019 MD [facility] Pager [phone].  If 7PM-7AM, please contact night-coverage [email or web address] Password [facility]  05/28/2019, 4:39 AM

## 2019-05-28 NOTE — Discharge Summary (Signed)
[facility] Discharge Summary   Patient: Craig Silva WUJ:[id number]   PCP: Patient, No Pcp Per DOB: [birth year]   Date of admission: 05/27/2019   Date of discharge: 05/28/2019     Discharge Diagnoses:  Principal Problem:   Chest pain Active Problems:   Elevated blood pressure reading   Admitted From: Home Disposition:  Home   Recommendations for Outpatient Follow-up:  1. PCP: Please establish care with PCP and have a follow-up in 1 week 2. Follow up LABS/TEST: None  Follow-up Information    PCP. Schedule an appointment as soon as possible for a visit in 1 month(s).   Why: establish care and follow up.         Diet recommendation: Cardiac diet  Activity: The patient is advised to gradually reintroduce usual activities,as tolerated  Discharge Condition: good  Code Status: Full code   History of present illness: As per the H and P dictated on admission, "Craig Silva is a 28 y.o. male with history of cocaine and alcohol abuse drinks alcohol every day and used cocaine 2 days ago with recent diagnosis of COVID-19 about 2 months ago presents to the ER because of persistent chest pain off and on for last 3 to 4 weeks.  Patient states his chest pain got more acutely worsened last evening around 2 PM.  Denies any fever chills productive cough shortness of breath nausea vomiting abdominal pain or diarrhea.  Patient states she had come to the ER 2 weeks ago with chest pain but left before he could be seen.  Patient chest pain is mostly in the left anterior chest wall and sometimes radiating to the neck and.  Present even at rest."  Hospital Course:  Summary of his active problems in the hospital is as following. 1. Chest pain in the setting of using cocaine with elevated blood pressure.  Troponins are negative.  No further chest pain reported by the patient.  No shortness of breath provide the patient.  No further work-up indicated for now. 2. Elevated blood pressure -adding  Norvasc 3. Acute renal failure could be having a chronic complaint given that patient's urine does show proteinuria.  Follow up with PCP 4. Cocaine and alcohol abuse placed on CIWA protocol advised to quit. 5. Recent Covid test was positive.  Covid test presently is pending.  No symptoms of Covid at this time 6. Obesity outpatient dietary consultation required.Body mass index is 35.51 kg/m.   Patient was ambulatory without any assistance. On the day of the discharge the patient's vitals were stable, and no other acute medical condition were reported by patient. the patient was felt safe to be discharge at Home with no therapy needed on discharge.  Consultants: none Procedures: none  DISCHARGE MEDICATION: Allergies as of 05/28/2019   No Known Allergies     Medication List    TAKE these medications   amLODipine 5 MG tablet Commonly known as: NORVASC Take 1 tablet (5 mg total) by mouth daily.   aspirin 81 MG EC tablet Take 1 tablet (81 mg total) by mouth daily. Start taking on: May 29, 2019   ondansetron 4 MG tablet Commonly known as: ZOFRAN Take 1 tablet (4 mg total) by mouth every 6 (six) hours.   thiamine 100 MG tablet Take 1 tablet (100 mg total) by mouth daily.      No Known Allergies Discharge Instructions    Diet - low sodium heart healthy   Complete by: As directed  Discharge instructions   Complete by: As directed    It is important that you read the given instructions as well as go over your medication list with RN to help you understand your care after this hospitalization.  Please follow-up with PCP in 1-2 weeks.  Please note that NO REFILLS for any discharge medications will be authorized once you are discharged, as it is imperative that you return to your primary care physician (or establish a relationship with a primary care physician if you do not have one) for your aftercare needs so that they can reassess your need for medications and monitor your  lab values.  Please request your primary care physician to go over all Hospital Tests and Procedure/Radiological results at the follow up. Please get all Hospital records sent to your PCP by signing hospital release before you go home.    Do not take more than prescribed Pain, Sleep and Anxiety Medications.  You were cared for by a hospitalist during your hospital stay. If you have any questions about your discharge medications or the care you received while you were in the hospital after you are discharged, you can call the unit @UNIT @ you were admitted to and ask to speak with the hospitalist Berle Mull. Ask for Hospitalist on call if the hospitalist that took care of you is not available.   Once you are discharged, your primary care physician will handle any further medical issues.  You Must read complete instructions/literature along with all the possible adverse reactions/side effects for all the Medicines you take and that have been prescribed to you. Take any new Medicines after you have completely understood and accept all the possible adverse reactions/side effects.  If you have smoked or chewed Tobacco in the last 2 yrs please STOP smoking STOP any Recreational drug use.  If you drink alcohol, please safely reduce the use. Do not drive, operating heavy machinery, perform activities at heights, swimming or participation in water activities or provide baby sitting services under influence.  Wear Seat belts while driving.   Increase activity slowly   Complete by: As directed      Discharge Exam: Filed Weights   05/27/19 1530  Weight: 99.8 kg   Vitals:   05/28/19 0855 05/28/19 0954  BP: 132/89 (!) 155/94  Pulse: 87 99  Resp: (!) 23 16  Temp:  98.4 F (36.9 C)  SpO2: 99% 100%   General: Appear in no distress, no Rash; Oral Mucosa Clear, moist. no Abnormal Mass Or lumps Cardiovascular: S1 and S2 Present, no Murmur, Respiratory: normal respiratory effort, Bilateral Air  entry present and Clear to Auscultation, no Crackles, no wheezes Abdomen: Bowel Sound present, Soft and no tenderness, no hernia Extremities: no Pedal edema, no calf tenderness Neurology: alert and oriented to time, place, and person affect appropriate.  The results of significant diagnostics from this hospitalization (including imaging, microbiology, ancillary and laboratory) are listed below for reference.    Significant Diagnostic Studies: DG Chest 2 View  Result Date: 05/27/2019 CLINICAL DATA:  Central chest pain EXAM: CHEST - 2 VIEW COMPARISON:  04/16/2019 FINDINGS: Low volume film with mild asymmetric elevation right hemidiaphragm. The lungs are clear without focal pneumonia, edema, pneumothorax or pleural effusion. The cardiopericardial silhouette is within normal limits for size. The visualized bony structures of the thorax are intact. IMPRESSION: No active cardiopulmonary disease. Electronically Signed   By: Misty Stanley M.D.   On: 05/27/2019 15:50    Microbiology: Recent Results (from the past  240 hour(s))  SARS CORONAVIRUS 2 (TAT 6-24 HRS) Nasopharyngeal Nasopharyngeal Swab     Status: None   Collection Time: 05/28/19  4:22 AM   Specimen: Nasopharyngeal Swab  Result Value Ref Range Status   SARS Coronavirus 2 NEGATIVE NEGATIVE Final    Comment: (NOTE) SARS-CoV-2 target nucleic acids are NOT DETECTED. The SARS-CoV-2 RNA is generally detectable in upper and lower respiratory specimens during the acute phase of infection. Negative results do not preclude SARS-CoV-2 infection, do not rule out co-infections with other pathogens, and should not be used as the sole basis for treatment or other patient management decisions. Negative results must be combined with clinical observations, patient history, and epidemiological information. The expected result is Negative. Fact Sheet for Patients: HairSlick.no Fact Sheet for Healthcare  Providers: quierodirigir.com This test is not yet approved or cleared by the [place] FDA and  has been authorized for detection and/or diagnosis of SARS-CoV-2 by FDA under an Emergency Use Authorization (EUA). This EUA will remain  in effect (meaning this test can be used) for the duration of the COVID-19 declaration under Section 56 4(b)(1) of the Act, 21 U.S.C. section 360bbb-3(b)(1), unless the authorization is terminated or revoked sooner. Performed at [facility], [address], [place], [place] [address]      Labs: CBC: Recent Labs  Lab 05/27/19 1536 05/28/19 0437  WBC 7.6 6.1  HGB 15.9 15.1  HCT 48.2 45.8  MCV 93.6 94.2  PLT 327 252   Basic Metabolic Panel: Recent Labs  Lab 05/27/19 1536 05/28/19 0437  NA 137 139  K 4.1 3.6  CL 101 102  CO2 24 24  GLUCOSE 121* 151*  BUN 11 11  CREATININE 1.30* 1.30*  CALCIUM 9.1 8.5*  MG  --  1.6*  PHOS  --  2.9   Liver Function Tests: Recent Labs  Lab 05/28/19 0437  AST 39  ALT 39  ALKPHOS 60  BILITOT 0.4  PROT 7.2  ALBUMIN 3.6   No results for input(s): LIPASE, AMYLASE in the last 168 hours. No results for input(s): AMMONIA in the last 168 hours. Cardiac Enzymes: No results for input(s): CKTOTAL, CKMB, CKMBINDEX, TROPONINI in the last 168 hours. BNP (last 3 results) No results for input(s): BNP in the last 8760 hours. CBG: No results for input(s): GLUCAP in the last 168 hours.  Time spent: 35 minutes  Signed:  Lynden Oxford  [facility] 05/28/2019 6:59 PM

## 2019-05-28 NOTE — ED Notes (Signed)
Pt alert and oriented X4. Pt called family to come pick up. Pt ambulated to lobby with steady gait.

## 2019-05-28 NOTE — ED Provider Notes (Signed)
TIME SEEN: 12:27 AM  CHIEF COMPLAINT: Chest pain  HPI: Patient is a 28 year old male who presents to the emergency department with complaints of throbbing, aching left-sided chest pain that started this morning when he woke up around 8:52 AM.  States the pain waxes and wanes but never completely resolves.  No aggravating or alleviating factors.  States at times he does feel short of breath and dizzy but no nausea or vomiting.  States he had a similar episode about a month ago where he felt like his body "locked up" and he became numb all over.  No numbness or focal weakness currently.  Is hypertensive here.  Has been told he has been hypertensive in the past but never started on medications.  Does not have a PCP.  States he thinks this may be secondary to drinking heavily.  States he drinks almost every day.  Last drink last night.  Also endorses occasional cocaine use.  Last used 2 to 3 days ago.  Has had intermittent headaches but none currently.  No vision changes.  Has been told his kidney function was elevated previously and was told to follow-up with [facility].  Denies fevers, cough, vomiting, diarrhea.   ROS: See HPI Constitutional: no fever  Eyes: no drainage  ENT: no runny nose   Cardiovascular:   chest pain  Resp:  SOB  GI: no vomiting GU: no dysuria Integumentary: no rash  Allergy: no hives  Musculoskeletal: no leg swelling  Neurological: no slurred speech ROS otherwise negative  PAST MEDICAL HISTORY/PAST SURGICAL HISTORY:  Past Medical History:  Diagnosis Date  . COVID-19   . Hypertension     MEDICATIONS:  Prior to Admission medications   Medication Sig Start Date End Date Taking? Authorizing Provider  diphenhydramine-acetaminophen (TYLENOL PM EXTRA STRENGTH) 25-500 MG TABS tablet Take 2-3 tablets by mouth at bedtime as needed (pain/fever/cough/sleep).    [provider]  DM-Doxylamine-Acetaminophen (NIGHTTIME COLD/FLU RELIEF PO) Take 2 tablets by mouth at  bedtime as needed (cough/congestion/fever).    [provider]  ondansetron (ZOFRAN) 4 MG tablet Take 1 tablet (4 mg total) by mouth every 6 (six) hours. 04/17/19   Couture, Cortni S, PA-C  Pseudoephedrine-APAP-DM (DAYTIME COLD/FLU PO) Take 1-2 tablets by mouth every 6 (six) hours as needed (cough/congestion/fever).    [provider]    ALLERGIES:  No Known Allergies  SOCIAL HISTORY:  Social History   Tobacco Use  . Smoking status: Never Smoker  . Smokeless tobacco: Never Used  Substance Use Topics  . Alcohol use: Not Currently    FAMILY HISTORY: No family history on file.  EXAM: BP (!) 173/126   Pulse (!) 102   Temp 98.7 F (37.1 C) (Oral)   Resp 17   Ht 5\' 6"  (1.676 m)   Wt 99.8 kg   SpO2 97%   BMI 35.51 kg/m  CONSTITUTIONAL: Alert and oriented and responds appropriately to questions. Well-appearing; well-nourished HEAD: Normocephalic EYES: Conjunctivae clear, pupils appear equal, EOM appear intact ENT: normal nose; moist mucous membranes NECK: Supple, normal ROM CARD: Regular and tachycardic; S1 and S2 appreciated; no murmurs, no clicks, no rubs, no gallops RESP: Normal chest excursion without splinting or tachypnea; breath sounds clear and equal bilaterally; no wheezes, no rhonchi, no rales, no hypoxia or respiratory distress, speaking full sentences ABD/GI: Normal bowel sounds; non-distended; soft, non-tender, no rebound, no guarding, no peritoneal signs, no hepatosplenomegaly BACK:  The back appears normal EXT: Normal ROM in all joints; no deformity noted,  no edema; no cyanosis SKIN: Normal color for age and race; warm; no rash on exposed skin NEURO: Moves all extremities equally, normal speech, normal sensation diffusely PSYCH: The patient's mood and manner are appropriate.   MEDICAL DECISION MAKING: Patient here with atypical chest pain.  He is extremely hypertensive and slightly tachycardic here.  Endorses last cocaine use over 48 hours ago.   States history of hypertension and has not been treated.  Will give dose of IV labetalol and reassess.  Patient assures me that he has not used cocaine in the last 24 hours.  EKG shows LVH but no ischemic change compared to previous.  First troponin XX 1.  Second troponin pending.  Symptoms seem atypical for ACS but may be related to hypertensive urgency.  Also check urine to evaluate for any signs of endorgan damage.  His creatinine is slightly elevated today at 1.3.  Will give IV fluids and avoid nephrotoxic medications.  Will treat pain with morphine.  He states he does have someone to drive him home.  Chest x-ray today is clear.  Doubt ACS, PE, dissection.  He does not appear volume overloaded.  No complaints of focal neurologic deficits, vision changes or headache currently.  No infectious symptoms currently.  ED PROGRESS: Patient's repeat troponin is 16.  Pain has improved but still having very mild throbbing left-sided chest pain.  Blood pressure improved significantly after labetalol but then became elevated again.  Given amlodipine without significant change.  Will give IV hydralazine.  He appears very comfortable currently.  He would like to go home if possible.  I feel his blood pressure needs to be controlled prior to discharge.  It appears in November his blood pressure was documented as normal several times.  Was documented elevated one time on November 14 and several times on his visit 05/11/2019 when he left without being seen.   Patient's BP continue to be elevated despite IV and oral meds.  Still having mild CP.  Not on meds for HTN and no PCP for follow up.  I am concerned about HTNsive urgency.  Will d/w medicine.   4:22 AM Discussed patient's case with hospitalist, Dr. Hal Hope.  I have recommended admission and patient (and family if present) agree with this plan. Admitting physician will place admission orders.   Hospitalist recommends giving Ativan and will admit due to chest pain  and cocaine use.    I reviewed all nursing notes, vitals, pertinent previous records and interpreted all EKGs, lab and urine results, imaging (as available).    CRITICAL CARE Performed by: Pryor Curia   Total critical care time: 45 minutes  Critical care time was exclusive of separately billable procedures and treating other patients.  Critical care was necessary to treat or prevent imminent or life-threatening deterioration.  Critical care was time spent personally by me on the following activities: development of treatment plan with patient and/or surrogate as well as nursing, discussions with consultants, evaluation of patient's response to treatment, examination of patient, obtaining history from patient or surrogate, ordering and performing treatments and interventions, ordering and review of laboratory studies, ordering and review of radiographic studies, pulse oximetry and re-evaluation of patient's condition.   Craig Silva was evaluated in Emergency Department on 05/28/2019 for the symptoms described in the history of present illness. He was evaluated in the context of the global COVID-19 pandemic, which necessitated consideration that the patient might be at risk for infection with the SARS-CoV-2 virus that causes COVID-19. Institutional  protocols and algorithms that pertain to the evaluation of patients at risk for COVID-19 are in a state of rapid change based on information released by regulatory bodies including the CDC and federal and state organizations. These policies and algorithms were followed during the patient's care in the ED.  Patient was seen wearing N95, face shield, gloves.    EKG Interpretation  Date/Time:  Friday May 27 2019 15:18:05 EST Ventricular Rate:  112 PR Interval:  136 QRS Duration: 86 QT Interval:  348 QTC Calculation: 475 R Axis:   122 Text Interpretation: Sinus tachycardia Right axis deviation T wave abnormality, consider inferior  ischemia Abnormal ECG No significant change since last tracing Confirmed by Aceson Labell, Baxter HireKristen [phone]([id number]) on 05/27/2019 11:10:46 PM         Sherilyn Windhorst, Layla MawKristen N, DO 05/28/19 [id number]

## 2019-05-28 NOTE — Discharge Instructions (Signed)
Stimulant Use Disorder-Cocaine Cocaine belongs to a group of powerful drugs known as stimulants. Common street names for cocaine include coke, crack, blow, snow, C, powder, and nose candy. Cocaine has some medical uses, but it is often misused because of the effects that it produces. These effects include:  A feeling of extreme pleasure (euphoria).  Alertness.  A high energy level. Stimulant use disorder is when your stimulant use disrupts your daily life. It may disrupt your relationships and how you do your job. Stimulant use disorder can be dangerous. Cocaine increases your blood pressure and heart rate. Using it can lead to a heart attack or stroke. Cocaine can also make your heart rate irregular and cause seizures. These problems can lead to death. What are the causes? This condition is caused by misusing cocaine. Many people start using cocaine because it makes them feel good. Over time, they get addicted to it. When they try to stop using it, they feel sick. What increases the risk? This condition is more likely to develop in:  People who misuse other drugs.  People with a family history of misusing drugs. What are the signs or symptoms? Symptoms of this condition include:  Using greater amounts of cocaine than you want to, or using cocaine for longer than you want to.  Trying several times to use less cocaine or to control your cocaine use.  Craving cocaine.  Spending a lot of time getting cocaine, using it, or recovering from its effects.  Having problems at work, at school, at home, or with relationships because of cocaine use.  Giving up or cutting down on important life activities because of cocaine use.  Using cocaine when it is dangerous, such as when driving a car.  Continuing to use cocaine even though it is causing or has led to a physical problem, such as: ? Malnutrition. ? Nosebleeds. ? Chest pain. ? High blood pressure. ? A hole between the part of your nose  that separates your nostrils (perforated nasal septum). ? Lung and kidney damage.  Continuing to use cocaine even though it is causing a mental problem, such as: ? Schizophrenia-like symptoms. ? Depression. ? Bipolar mood swings. ? Anxiety. ? Sleep problems.  Needing more and more cocaine to get the same effect that you want (building up a tolerance).  Having symptoms of withdrawal when you stop using cocaine. Symptoms of withdrawal include: ? Depression. ? Irritability. ? Low energy. ? Restlessness. ? Bad dreams. ? Too little or too much sleep. ? Increased appetite. How is this diagnosed? This condition is diagnosed with an assessment. During the assessment, your health care provider will ask about your cocaine use and about how it affects your life. Your health care provider may also:  Perform a physical exam or do lab tests to see if you have physical problems resulting from cocaine use.  Screen for drug use.  Refer you to a mental health professional for evaluation. How is this treated? Treatment for this condition is usually provided by mental health professionals with training in substance use disorders. Treatment may involve:  Counseling. This treatment is also called talk therapy. It is provided by substance use treatment counselors. A counselor can address the reasons you use cocaine and suggest ways to keep you from using it again. The goals of talk therapy are to: ? Find healthy activities to replace using cocaine. ? Identify and avoid what triggers your cocaine use. ? Help you learn how to handle cravings.  Support groups.  Support groups are run by people who have quit using stimulants. They provide emotional support, advice, and guidance.  Medicines. Follow these instructions at home:  Take over-the-counter and prescription medicines only as told by your health care provider.  Check with your health care provider before starting any new medicines.  Do not use  any products that contain nicotine or tobacco, such as cigarettes and e-cigarettes. If you need help quitting, ask your health care provider.  Keep all follow-up visits as told by your health care provider. This is important. Where to find more information  [employer] on Drug Abuse: motorcyclefax.com  Substance Abuse and Mental Health Services Administration: ktimeonline.com Contact a health care provider if:  You are not able to take your medicines as told.  You use cocaine again.  Your symptoms get worse. Get help right away if:  You have serious thoughts about hurting yourself or others.  You have a seizure.  You have chest pain.  You have sudden weakness.  You lose some of your vision.  You lose some of your speech. If you ever feel like you may hurt yourself or others, or have thoughts about taking your own life, get help right away. You can go to your nearest emergency department or call:  Your local emergency services (911 in the [place]  A suicide crisis helpline, such as the [place] at (301) 249-8888. This is open 24 hours a day. This information is not intended to replace advice given to you by your health care provider. Make sure you discuss any questions you have with your health care provider. Document Released: 05/16/2000 Document Revised: 05/01/2017 Document Reviewed: 02/29/2016 Elsevier Patient Education  2020 [employer].  Finding Treatment for [place] is a complex disease of the brain that causes an uncontrollable (compulsive) need for:  A substance. This includes alcohol, illegal drugs, or prescription medicines, such as painkillers.  An activity or behavior, such as gambling or shopping. Addiction changes the way your brain works. Because of this change:  The need for the medicine, drug, or activity can become so strong that you think about it all the time.  Getting more and more of your addiction  becomes the most important thing to you.  You may find yourself leaving other activities and relationships to pursue your addiction.  You can become physically dependent on a substance.  Your health, behavior, emotions, and relationships can change for the worse. How do I know if I need treatment for addiction? Addiction is a progressive disease. Without treatment, addiction can get worse. Living with addiction puts you at higher risk for injury, poor health, loss of employment, loss of money, and even death. You might need treatment for addiction if:  You have tried to stop or cut down, but you have not succeeded.  You find it annoying that your friends and family are concerned about your use or behavior.  You feel guilty about your use or behavior.  You need a particular substance or activity to start your day or to calm down.  You are running out of money because of your addiction.  You have done something illegal to support your addiction.  Your addiction has caused you: ? Health problems. ? Trouble in school, work, home, or with the police. ? To devote all your time to your addiction, and not to other responsibilities. ? To tell lies in order to hide your problem. What types of treatment are available? There may be options for  treatment programs and plans based on your addiction, condition, needs, and preferences. No single treatment is right for everyone.  Treatment programs can be: ? Outpatient. You live at home and go to work or school, but you go to a clinic for treatment. ? Inpatient. You live and sleep at the program facility during treatment.  Programs may include: ? Medicine. You may need medicine to treat the addiction itself, or to treat anxiety or depression. ? Counseling and behavior therapy. This can help individuals and families behave in healthier ways and relate more effectively. ? Support groups. Confidential group therapy, such as a 12-step program, can  help individuals and families during treatment and recovery. ? A combination of education, counseling, and a 12-step, spirituality-based approach. What should I consider when selecting a treatment program? Think about your individual requirements when selecting a treatment program. Ask about:  The overall approach to treatment. ? Some programs are strictly 12-step programs. Some have a more flexible approach. ? Programs may differ in length of stay, setting, and size. ? Some programs include your family in your treatment plan. Support may be offered to them throughout the treatment process, as well as instructions for them when you are discharged. ? You may continue to receive support after you have left the program.  The types of medical services that are offered. Find out if the program: ? Offers specific treatment for your particular addiction. ? Meets all of your needs, including physical and cultural needs. ? Includes any medicines you might need. ? Offers mental health counseling as part of your treatment. ? Offers the 12-step meetings at the center, or if transport is available for patients to attend meetings at other locations.  The cost and types of insurance that are accepted. ? Some programs are sponsored by the government. They support patients who do not have private insurance. ? If you do not have insurance, or if you choose to attend a program that does not accept your insurance, call the treatment center. Tell them your financial needs and whether a payment plan can be set up. ? There are also organizations that will help you find the resources for treatment. You can find them online by searching "treatment for addiction."  If the program is certified by the appropriate government agency. Where to find support  Your health care provider can help you to find the right treatment. These discussions are confidential.  The [employer] on Alcoholism and Drug Dependence  (NCADD). This group has information about treatment centers and programs for people who have an addiction and for family members. ? Call: 1-800-NCA-CALL (606-354-86971-(989)632-6157). ? Visit the website: https://www.ncadd.org/  The Substance Abuse and Mental Health Services Administration [facility](SAMHSA). This organization will help you find publicly funded treatment centers, help hotlines, and counseling services near you. ? Call: 1-800-662-HELP (825-552-58431-(403) 301-8082). ? Visit the website: www.findtreatment.RockToxic.plsamhsa.gov  The National Problem Gambling Helpline. This is a 24-hour confidential helpline for gambling addiction. ? Call: 615-600-95051-949-707-0130 ? Visit the website: CocoaInvestor.tnhttps://www.ncpgambling.org/ In countries outside of the [place] and [place], look in [employer] directories for contact information for services in your area. Follow these instructions at home:  Find supportive people who will help you stay away from your addiction and stay sober.  Do not use the substance or engage in the activity.  If you have been through treatment: ? Follow your plan. The plan is usually developed by you and your health care provider during treatment. ? Go to meetings with other people in recovery. ?  Avoid people, situations, and things that lead you to do the things you are addicted to (triggers). Summary  Addiction changes the way your brain works. These changes cause a desire to repeat and increase the use of the a substance or behavior.  Addiction is a progressive disease. Without treatment, addiction can get worse. Living with addiction puts you at higher risk for injury, poor health, loss of employment, loss of money, and even death.  There may be options for treatment programs and plans based on your addiction, condition, needs, and preferences. No single treatment is right for everyone.  Your health care provider can help you to find the right treatment. These discussions are confidential. This information is not intended to  replace advice given to you by your health care provider. Make sure you discuss any questions you have with your health care provider. Document Released: 04/17/2005 Document Revised: 06/17/2017 Document Reviewed: 06/17/2017 Elsevier Patient Education  2020 [employer].

## 2020-07-01 IMAGING — DX DG CHEST 2V
2 series · 2 of 2 positions shown · non-contrast
Comparison: 04/16/2019

CLINICAL DATA: Central chest pain

EXAM:
CHEST - 2 VIEW

[w chest pa]
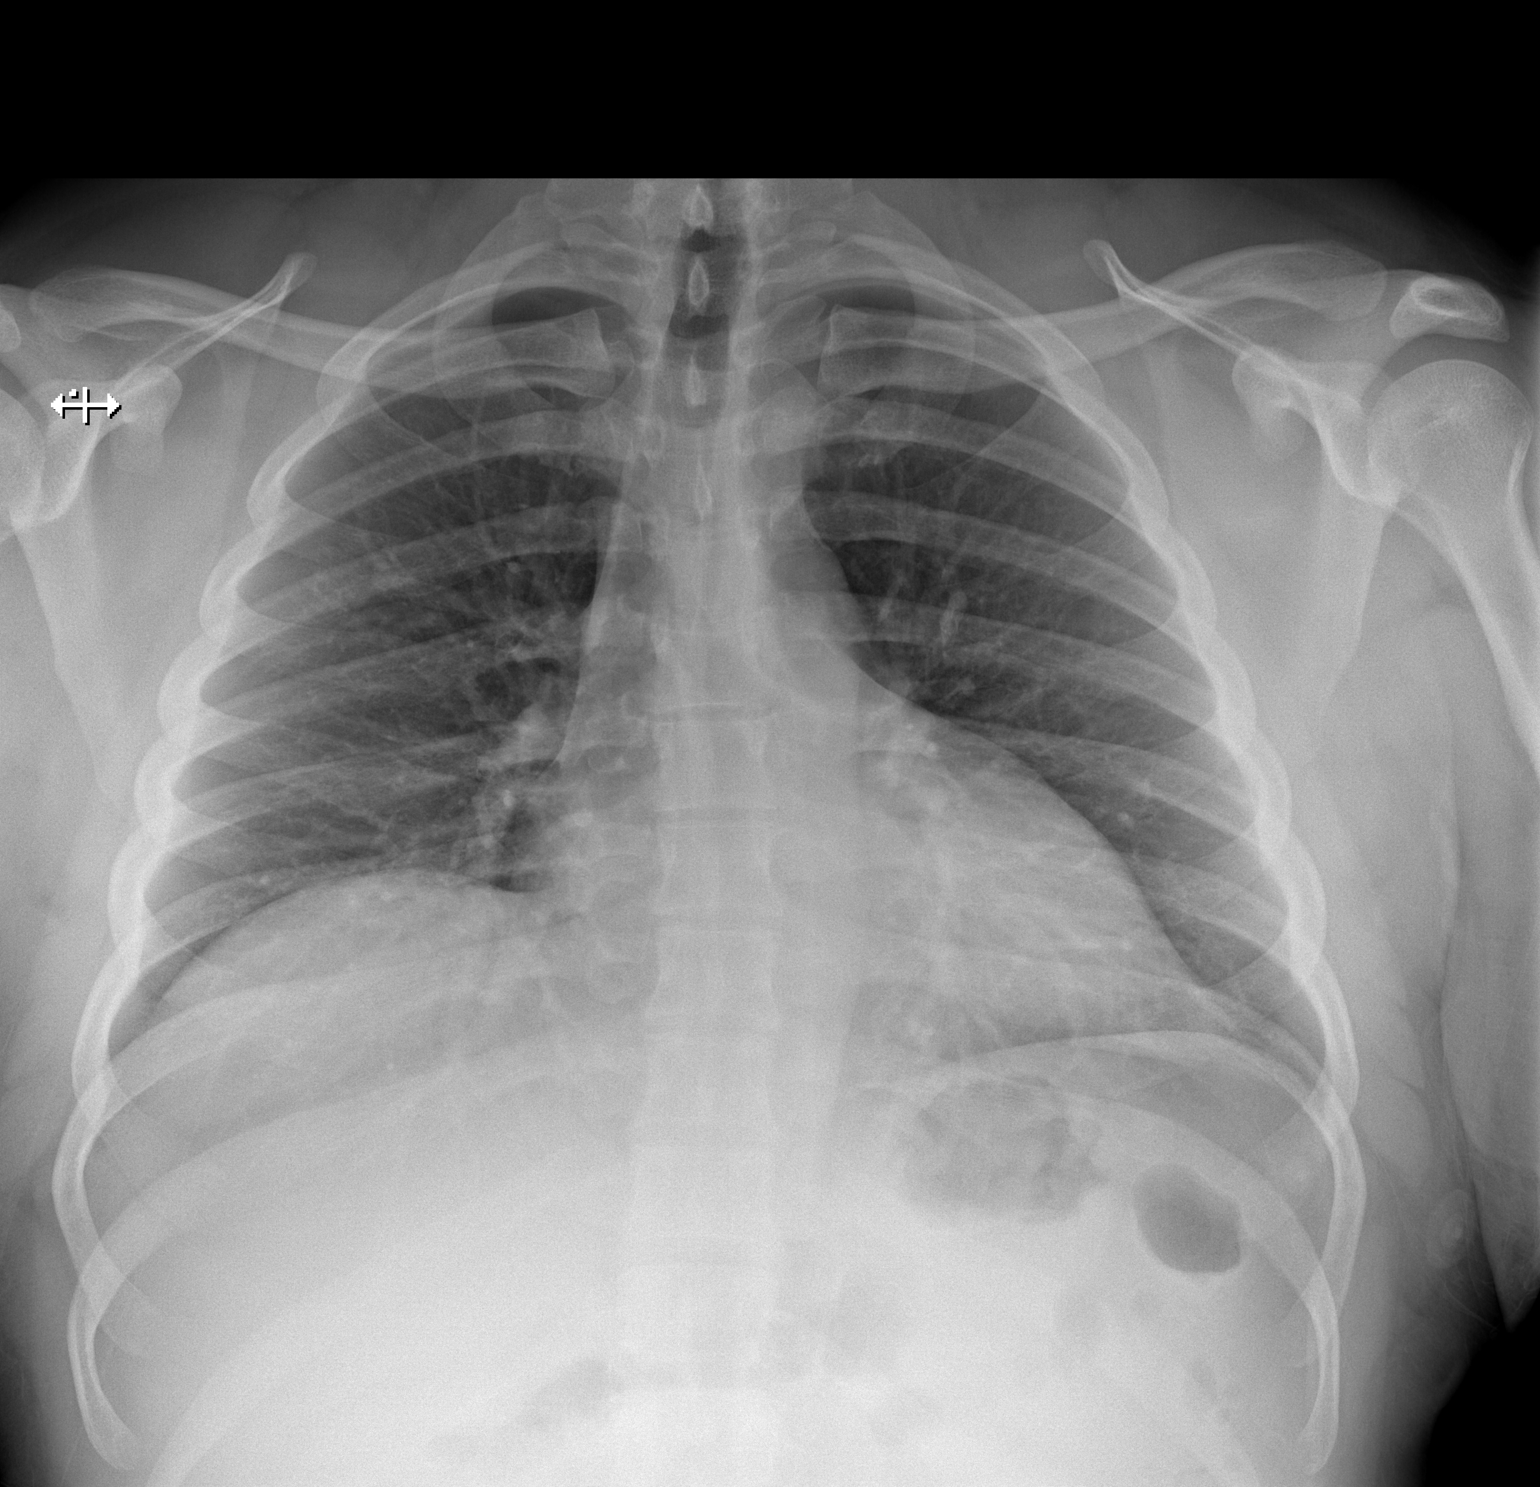

[w chest lat]
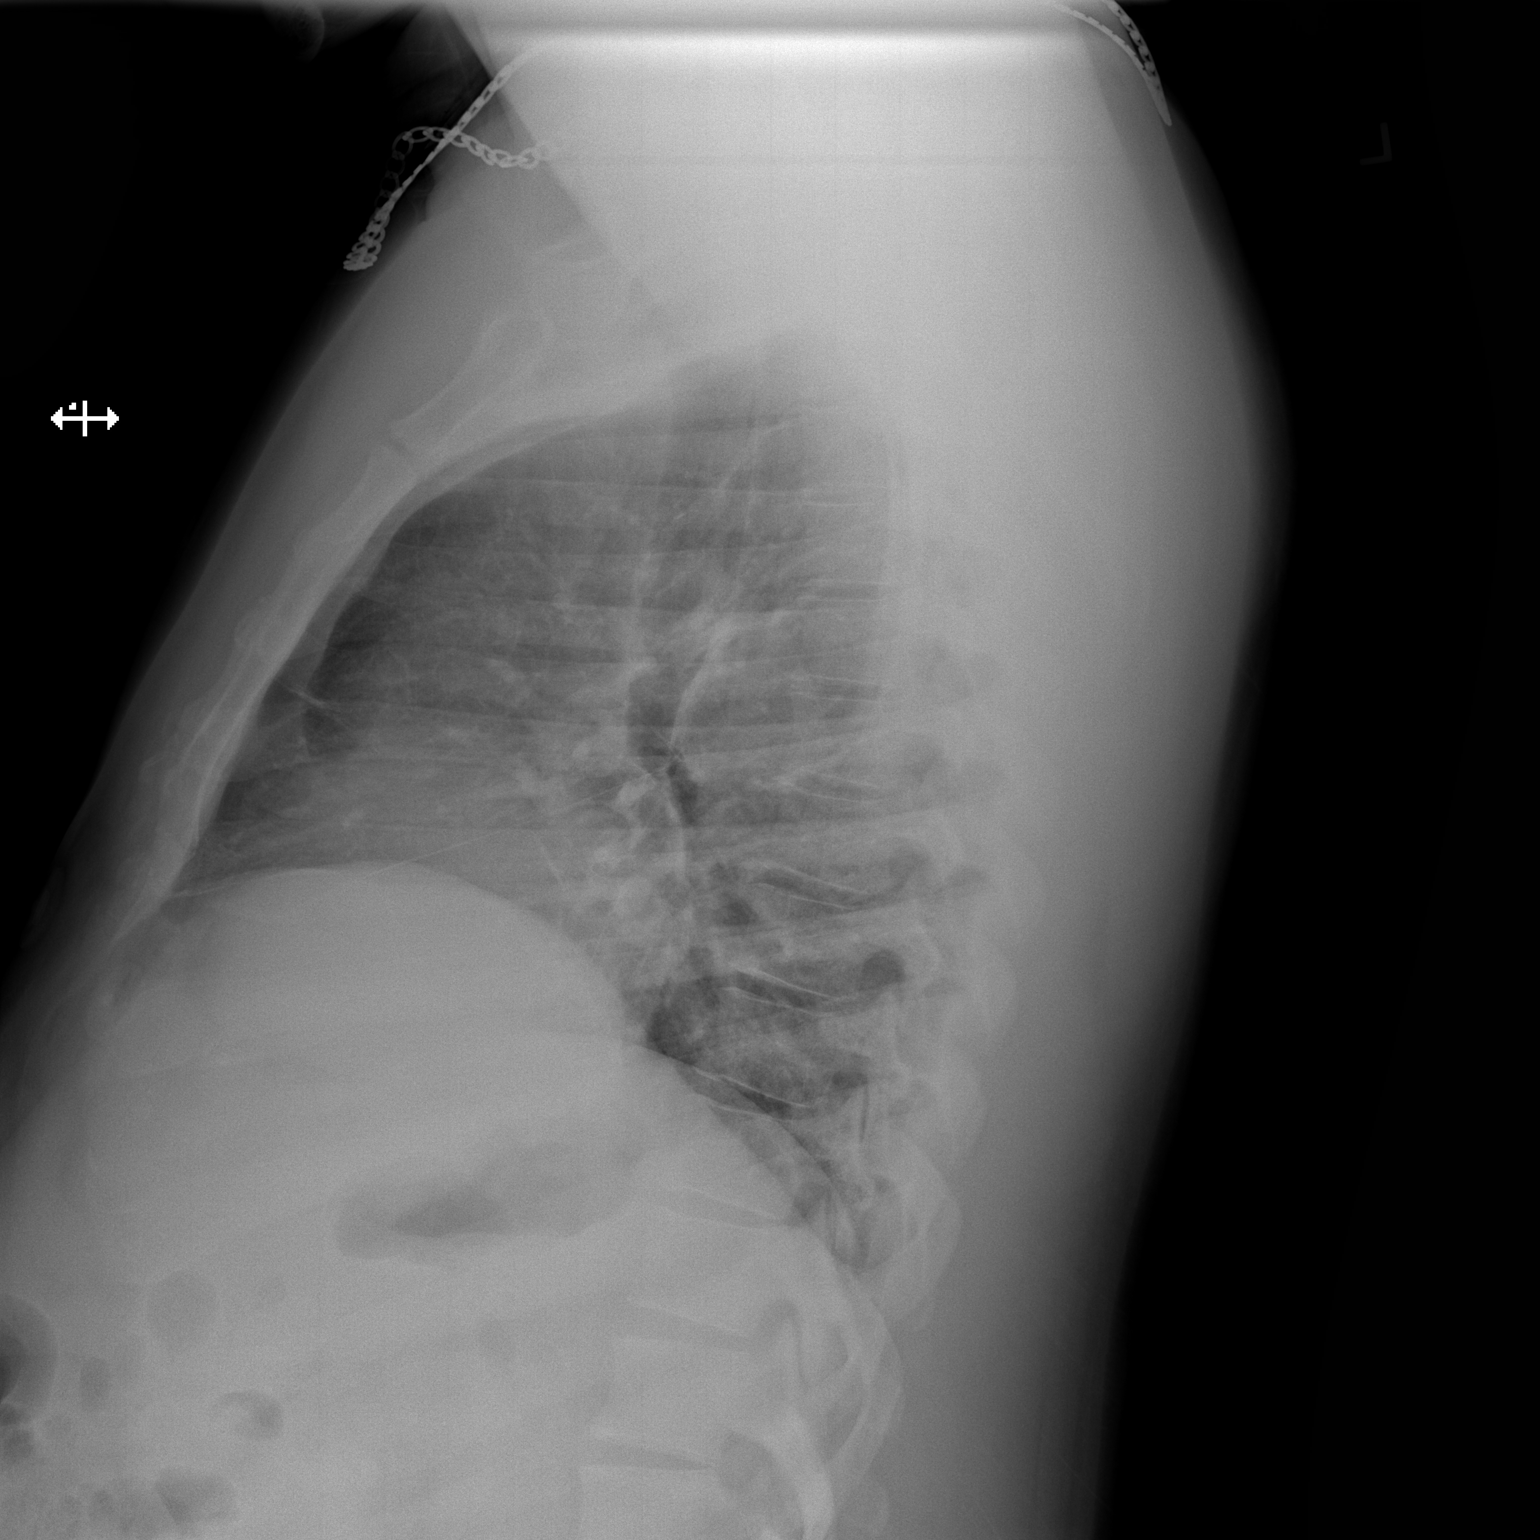

[2 of 2 positions shown; findings below may reference images not displayed]

FINDINGS: Low volume film with mild asymmetric elevation right hemidiaphragm.
The lungs are clear without focal pneumonia, edema, pneumothorax or
pleural effusion. The cardiopericardial silhouette is within normal
limits for size. The visualized bony structures of the thorax are
intact.
IMPRESSION: No active cardiopulmonary disease.

## 2020-08-17 ENCOUNTER — Other Ambulatory Visit: Payer: Self-pay | Admitting: Nephrology

## 2020-08-17 DIAGNOSIS — N1831 Chronic kidney disease, stage 3a: Secondary | ICD-10-CM

## 2020-09-24 ENCOUNTER — Other Ambulatory Visit: Payer: Self-pay

## 2020-12-10 ENCOUNTER — Other Ambulatory Visit: Payer: Medicaid Other

## 2021-07-15 ENCOUNTER — Encounter: Payer: Self-pay | Admitting: Emergency Medicine

## 2021-07-15 ENCOUNTER — Encounter (HOSPITAL_BASED_OUTPATIENT_CLINIC_OR_DEPARTMENT_OTHER): Payer: Self-pay

## 2021-07-15 ENCOUNTER — Emergency Department (HOSPITAL_BASED_OUTPATIENT_CLINIC_OR_DEPARTMENT_OTHER)
Admission: EM | Admit: 2021-07-15 | Discharge: 2021-07-15 | Disposition: A | Discharge status: Home / Self Care | Payer: Medicaid Other | Attending: Emergency Medicine | Admitting: Emergency Medicine

## 2021-07-15 ENCOUNTER — Other Ambulatory Visit: Payer: Self-pay

## 2021-07-15 ENCOUNTER — Ambulatory Visit
Admission: EM | Admit: 2021-07-15 | Discharge: 2021-07-15 | Disposition: A | Discharge status: Home / Self Care | Payer: Medicaid Other | Attending: Physician Assistant | Admitting: Physician Assistant

## 2021-07-15 ENCOUNTER — Emergency Department (HOSPITAL_BASED_OUTPATIENT_CLINIC_OR_DEPARTMENT_OTHER): Payer: Medicaid Other

## 2021-07-15 DIAGNOSIS — Z79899 Other long term (current) drug therapy: Secondary | ICD-10-CM | POA: Insufficient documentation

## 2021-07-15 DIAGNOSIS — R000 Tachycardia, unspecified: Secondary | ICD-10-CM | POA: Insufficient documentation

## 2021-07-15 DIAGNOSIS — E878 Other disorders of electrolyte and fluid balance, not elsewhere classified: Secondary | ICD-10-CM | POA: Insufficient documentation

## 2021-07-15 DIAGNOSIS — D751 Secondary polycythemia: Secondary | ICD-10-CM | POA: Insufficient documentation

## 2021-07-15 DIAGNOSIS — E871 Hypo-osmolality and hyponatremia: Secondary | ICD-10-CM | POA: Insufficient documentation

## 2021-07-15 DIAGNOSIS — J101 Influenza due to other identified influenza virus with other respiratory manifestations: Secondary | ICD-10-CM | POA: Insufficient documentation

## 2021-07-15 DIAGNOSIS — Z7982 Long term (current) use of aspirin: Secondary | ICD-10-CM | POA: Insufficient documentation

## 2021-07-15 DIAGNOSIS — R0682 Tachypnea, not elsewhere classified: Secondary | ICD-10-CM

## 2021-07-15 DIAGNOSIS — J111 Influenza due to unidentified influenza virus with other respiratory manifestations: Secondary | ICD-10-CM

## 2021-07-15 LAB — BASIC METABOLIC PANEL
Anion gap: 10 (ref 5–15)
BUN: 10 mg/dL (ref 6–20)
CO2: 27 mmol/L (ref 22–32)
Calcium: 9.3 mg/dL (ref 8.9–10.3)
Chloride: 96 mmol/L — ABNORMAL LOW (ref 98–111)
Creatinine, Ser: 1.75 mg/dL — ABNORMAL HIGH (ref 0.61–1.24)
GFR, Estimated: 52 mL/min — ABNORMAL LOW (ref >60)
Glucose, Bld: 104 mg/dL — ABNORMAL HIGH (ref 70–99)
Potassium: 3.9 mmol/L (ref 3.5–5.1)
Sodium: 133 mmol/L — ABNORMAL LOW (ref 135–145)

## 2021-07-15 LAB — CBC WITH DIFFERENTIAL/PLATELET
Abs Immature Granulocytes: 0.04 10*3/uL (ref 0.00–0.07)
Basophils Absolute: 0 10*3/uL (ref 0.0–0.1)
Basophils Relative: 0 %
Eosinophils Absolute: 0 10*3/uL (ref 0.0–0.5)
Eosinophils Relative: 0 %
HCT: 44.3 % (ref 39.0–52.0)
Hemoglobin: 14.4 g/dL (ref 13.0–17.0)
Immature Granulocytes: 0 %
Lymphocytes Relative: 5 %
Lymphs Abs: 0.6 10*3/uL — ABNORMAL LOW (ref 0.7–4.0)
MCH: 30.2 pg (ref 26.0–34.0)
MCHC: 32.5 g/dL (ref 30.0–36.0)
MCV: 92.9 fL (ref 80.0–100.0)
Monocytes Absolute: 0.6 10*3/uL (ref 0.1–1.0)
Monocytes Relative: 5 %
Neutro Abs: 9.2 10*3/uL — ABNORMAL HIGH (ref 1.7–7.7)
Neutrophils Relative %: 90 %
Platelets: 189 10*3/uL (ref 150–400)
RBC: 4.77 MIL/uL (ref 4.22–5.81)
RDW: 13.2 % (ref 11.5–15.5)
WBC: 10.5 10*3/uL (ref 4.0–10.5)
nRBC: 0 % (ref 0.0–0.2)

## 2021-07-15 LAB — LACTIC ACID, PLASMA: Lactic Acid, Venous: 1.3 mmol/L (ref 0.5–1.9)

## 2021-07-15 LAB — POCT INFLUENZA A/B
Influenza A, POC: POSITIVE — AB
Influenza B, POC: NEGATIVE

## 2021-07-15 LAB — BRAIN NATRIURETIC PEPTIDE: B Natriuretic Peptide: 14.9 pg/mL (ref 0.0–100.0)

## 2021-07-15 MED ORDER — IPRATROPIUM-ALBUTEROL 0.5-2.5 (3) MG/3ML IN SOLN
3.0000 mL | RESPIRATORY_TRACT | Status: AC
  Administered 2021-07-15: 3 mL via RESPIRATORY_TRACT
  Filled 2021-07-15: qty 3

## 2021-07-15 MED ORDER — SODIUM CHLORIDE 0.9 % IV BOLUS
1000.0000 mL | Freq: Once | INTRAVENOUS | Status: AC
  Administered 2021-07-15: 1000 mL via INTRAVENOUS

## 2021-07-15 MED ORDER — ALBUTEROL SULFATE (2.5 MG/3ML) 0.083% IN NEBU
2.5000 mg | INHALATION_SOLUTION | Freq: Once | RESPIRATORY_TRACT | Status: AC
  Administered 2021-07-15: 2.5 mg via RESPIRATORY_TRACT
  Filled 2021-07-15: qty 3

## 2021-07-15 MED ORDER — ACETAMINOPHEN 325 MG PO TABS
325.0000 mg | ORAL_TABLET | Freq: Once | ORAL | Status: AC
  Administered 2021-07-15: 325 mg via ORAL
  Filled 2021-07-15: qty 1

## 2021-07-15 MED ORDER — ACETAMINOPHEN 500 MG PO TABS: 1000.0000 mg | ORAL_TABLET | Freq: Four times a day (QID) | ORAL | 0 refills | Status: AC

## 2021-07-15 MED ORDER — IBUPROFEN 400 MG PO TABS
400.0000 mg | ORAL_TABLET | Freq: Once | ORAL | Status: AC
  Administered 2021-07-15: 400 mg via ORAL
  Filled 2021-07-15: qty 1

## 2021-07-15 MED ORDER — ALBUTEROL SULFATE HFA 108 (90 BASE) MCG/ACT IN AERS
2.0000 | INHALATION_SPRAY | Freq: Once | RESPIRATORY_TRACT | Status: AC
  Administered 2021-07-15: 2 via RESPIRATORY_TRACT
  Filled 2021-07-15: qty 6.7

## 2021-07-15 MED ORDER — DIPHENHYDRAMINE HCL 25 MG PO CAPS
25.0000 mg | ORAL_CAPSULE | Freq: Once | ORAL | Status: AC
  Administered 2021-07-15: 25 mg via ORAL
  Filled 2021-07-15: qty 1

## 2021-07-15 MED ORDER — AEROCHAMBER PLUS FLO-VU MEDIUM MISC
1.0000 | Freq: Once | Status: DC
  Filled 2021-07-15: qty 1

## 2021-07-15 MED ORDER — METHYLPREDNISOLONE SODIUM SUCC 125 MG IJ SOLR
125.0000 mg | INTRAMUSCULAR | Status: AC
  Administered 2021-07-15: 125 mg via INTRAVENOUS
  Filled 2021-07-15: qty 2

## 2021-07-15 MED ORDER — PREDNISONE 10 MG PO TABS: 30.0000 mg | ORAL_TABLET | Freq: Every day | ORAL | 0 refills | Status: AC

## 2021-07-15 MED ORDER — METOCLOPRAMIDE HCL 5 MG/ML IJ SOLN
10.0000 mg | Freq: Once | INTRAMUSCULAR | Status: AC
  Administered 2021-07-15: 10 mg via INTRAVENOUS
  Filled 2021-07-15: qty 2

## 2021-07-15 MED ORDER — ONDANSETRON 4 MG PO TBDP: 4.0000 mg | ORAL_TABLET | Freq: Three times a day (TID) | ORAL | 0 refills | Status: DC | PRN

## 2021-07-15 MED ORDER — ACETAMINOPHEN 325 MG PO TABS
650.0000 mg | ORAL_TABLET | Freq: Once | ORAL | Status: AC
  Administered 2021-07-15: 650 mg via ORAL

## 2021-07-15 MED ORDER — ACETAMINOPHEN 500 MG PO TABS
1000.0000 mg | ORAL_TABLET | Freq: Once | ORAL | Status: AC
  Administered 2021-07-15: 1000 mg via ORAL
  Filled 2021-07-15: qty 2

## 2021-07-15 NOTE — Progress Notes (Signed)
Pt has a fever and his lungs are clear. Pt's BP is elevated.

## 2021-07-15 NOTE — ED Notes (Signed)
ED Provider at bedside. 

## 2021-07-15 NOTE — ED Provider Notes (Signed)
[facility]    CSN: [id number] Arrival date & time: 07/15/21  0947      History   Chief Complaint Chief Complaint  Patient presents with   Cough    HPI Craig Silva is a 31 y.o. male.   Patient here today for evaluation of cough, congestion, shortness of breath, headache, chest pain and fever that started 2 days ago.  He has not taken any medication.  He denies any history of asthma.  The history is provided by the patient.  Cough Associated symptoms: chest pain, chills, fever, shortness of breath and sore throat   Associated symptoms: no ear pain and no eye discharge    Past Medical History:  Diagnosis Date   COVID-19    Hypertension     Patient Active Problem List   Diagnosis Date Noted   Chest pain 05/28/2019   Elevated blood pressure reading 05/28/2019    History reviewed. No pertinent surgical history.     Home Medications    Prior to Admission medications   Medication Sig Start Date End Date Taking? Authorizing Provider  amLODipine (NORVASC) 5 MG tablet Take 1 tablet (5 mg total) by mouth daily. 05/28/19 05/27/20  Rolly Salter, MD  aspirin EC 81 MG EC tablet Take 1 tablet (81 mg total) by mouth daily. 05/29/19   Rolly Salter, MD  ondansetron (ZOFRAN) 4 MG tablet Take 1 tablet (4 mg total) by mouth every 6 (six) hours. Patient not taking: Reported on 05/28/2019 04/17/19   Couture, Cortni S, PA-C  thiamine 100 MG tablet Take 1 tablet (100 mg total) by mouth daily. 05/28/19   Rolly Salter, MD    Family History Family History  Problem Relation Age of Onset   CAD Neg Hx    Diabetes Mellitus II Neg Hx     Social History Social History   Tobacco Use   Smoking status: Never   Smokeless tobacco: Never  Substance Use Topics   Alcohol use: Yes    Comment: Every day.   Drug use: Yes    Types: Cocaine     Allergies   Patient has no known allergies.   Review of Systems Review of Systems  Constitutional:  Positive for  chills and fever.  HENT:  Positive for congestion and sore throat. Negative for ear pain.   Eyes:  Negative for discharge and redness.  Respiratory:  Positive for cough and shortness of breath.   Cardiovascular:  Positive for chest pain.  Gastrointestinal:  Negative for abdominal pain, nausea and vomiting.    Physical Exam Triage Vital Signs ED Triage Vitals  Enc Vitals Group     BP 07/15/21 1052 (!) 154/92     Pulse Rate 07/15/21 1052 (!) 137     Resp 07/15/21 1052 (!) 28     Temp 07/15/21 1052 (!) 103 F (39.4 C)     Temp Source 07/15/21 1052 Oral     SpO2 07/15/21 1052 93 %     Weight --      Height --      Head Circumference --      Peak Flow --      Pain Score 07/15/21 1053 8     Pain Loc --      Pain Edu? --      Excl. in GC? --    No data found.  Updated Vital Signs BP (!) 154/92 (BP Location: Right Arm)    Pulse (!) 132  Temp (!) 101.8 F (38.8 C) (Oral)    Resp (!) 24    SpO2 94%    Physical Exam Vitals and nursing note reviewed.  Constitutional:      General: He is not in acute distress.    Appearance: Normal appearance. He is not ill-appearing.  HENT:     Head: Normocephalic and atraumatic.     Nose: Congestion present.  Eyes:     Conjunctiva/sclera: Conjunctivae normal.  Cardiovascular:     Rate and Rhythm: Regular rhythm. Tachycardia present.     Heart sounds: Normal heart sounds. No murmur heard. Pulmonary:     Effort: Respiratory distress (tachypnea noted) present.     Breath sounds: Normal breath sounds. No wheezing, rhonchi or rales.  Skin:    General: Skin is warm and dry.  Neurological:     Mental Status: He is alert.  Psychiatric:        Mood and Affect: Mood normal.        Thought Content: Thought content normal.     UC Treatments / Results  Labs (all labs ordered are listed, but only abnormal results are displayed) Labs Reviewed  POCT INFLUENZA A/B - Abnormal; Notable for the following components:      Result Value   Influenza  A, POC Positive (*)    All other components within normal limits    EKG   Radiology No results found.  Procedures Procedures (including critical care time)  Medications Ordered in UC Medications  acetaminophen (TYLENOL) tablet 650 mg (650 mg Oral Given 07/15/21 1100)    Initial Impression / Assessment and Plan / UC Course  I have reviewed the triage vital signs and the nursing notes.  Pertinent labs & imaging results that were available during my care of the patient were reviewed by me and considered in my medical decision making (see chart for details).    Flu test positive in office.  Tylenol administered to decrease temperature which was somewhat successful however patient continues to be tachypneic and seems to have shortness of breath on exam.  Recommended further evaluation in the emergency department, and patient is agreeable to staying.  Girlfriend drove patient to our office and will drive to the emergency department as well.  Final Clinical Impressions(s) / UC Diagnoses   Final diagnoses:  Influenza with respiratory manifestation  Tachypnea   Discharge Instructions   None    ED Prescriptions   None    PDMP not reviewed this encounter.   Francene Finders, PA-C 07/15/21 1156

## 2021-07-15 NOTE — ED Provider Notes (Signed)
[place] EMERGENCY DEPT Provider Note   CSN: [id number] Arrival date & time: 07/15/21  1235     History  Chief Complaint  Patient presents with   Influenza    Craig Silva is a 31 y.o. male.   Influenza Patient is a 31 year old male with past medical history significant for hypertension  He was diagnosed with influenza this morning urgent care he states he has had approximately 3 days of cough fever short of breath congestion headaches myalgias and malaise  He denies any chest pain but states that he feels tight in his chest and has noticed that his breathing is more difficult.  He denies any hemoptysis leg swelling unilateral leg pain or swelling.  When he was seen in the urgent care he was breathing quickly and noted to have a fever and rapid heartbeat was sent to the emergency room for further evaluation.       Home Medications Prior to Admission medications   Medication Sig Start Date End Date Taking? Authorizing Provider  amLODipine (NORVASC) 5 MG tablet Take 1 tablet (5 mg total) by mouth daily. 05/28/19 05/27/20  Lavina Hamman, MD  aspirin EC 81 MG EC tablet Take 1 tablet (81 mg total) by mouth daily. 05/29/19   Lavina Hamman, MD  ondansetron (ZOFRAN) 4 MG tablet Take 1 tablet (4 mg total) by mouth every 6 (six) hours. Patient not taking: Reported on 05/28/2019 04/17/19   Couture, Cortni S, PA-C  thiamine 100 MG tablet Take 1 tablet (100 mg total) by mouth daily. 05/28/19   Lavina Hamman, MD      Allergies    Patient has no known allergies.    Review of Systems   Review of Systems  Physical Exam Updated Vital Signs BP (!) 176/104    Pulse (!) 125    Temp (!) 101.7 F (38.7 C)    Resp (!) 35    Ht 5\' 6"  (1.676 m)    Wt 99.8 kg    SpO2 96%    BMI 35.51 kg/m  Physical Exam Vitals and nursing note reviewed.  Constitutional:      General: He is in acute distress.     Appearance: He is obese. He is ill-appearing. He is not  toxic-appearing or diaphoretic.     Comments: Uncomfortable appearing 31 year old male, tachypneic  HENT:     Head: Normocephalic and atraumatic.     Nose: Nose normal.     Mouth/Throat:     Mouth: Mucous membranes are dry.  Eyes:     General: No scleral icterus. Cardiovascular:     Rate and Rhythm: Regular rhythm. Tachycardia present.     Pulses: Normal pulses.     Heart sounds: Normal heart sounds.     Comments: Heart rate approximately 125, regular rhythm Pulmonary:     Effort: No respiratory distress.     Breath sounds: Wheezing present.     Comments: Coarse lung sounds throughout, end expiratory wheezing Abdominal:     Palpations: Abdomen is soft.     Tenderness: There is no abdominal tenderness. There is no guarding or rebound.  Musculoskeletal:     Cervical back: Normal range of motion.     Right lower leg: No edema.     Left lower leg: No edema.  Skin:    General: Skin is warm and dry.     Capillary Refill: Capillary refill takes less than 2 seconds.  Neurological:     Mental Status: He  is alert. Mental status is at baseline.  Psychiatric:        Mood and Affect: Mood normal.        Behavior: Behavior normal.    ED Results / Procedures / Treatments   Labs (all labs ordered are listed, but only abnormal results are displayed) Labs Reviewed  CBC WITH DIFFERENTIAL/PLATELET - Abnormal; Notable for the following components:      Result Value   Neutro Abs 9.2 (*)    Lymphs Abs 0.6 (*)    All other components within normal limits  BASIC METABOLIC PANEL - Abnormal; Notable for the following components:   Sodium 133 (*)    Chloride 96 (*)    Glucose, Bld 104 (*)    Creatinine, Ser 1.75 (*)    GFR, Estimated 52 (*)    All other components within normal limits  BRAIN NATRIURETIC PEPTIDE  LACTIC ACID, PLASMA    EKG None  Radiology DG Chest 1 View  Result Date: 07/15/2021 CLINICAL DATA:  Cough EXAM: CHEST  1 VIEW COMPARISON:  05/27/2019 FINDINGS: Midline  trachea. Apical lordotic positioning. Normal heart size for level of inspiration. No pleural effusion or pneumothorax. Mild right hemidiaphragm elevation. No congestive failure. Clear lungs. IMPRESSION: No acute cardiopulmonary disease. Electronically Signed   By: Abigail Miyamoto M.D.   On: 07/15/2021 14:53    Procedures Procedures    Medications Ordered in ED Medications  sodium chloride 0.9 % bolus 1,000 mL (has no administration in time range)  sodium chloride 0.9 % bolus 1,000 mL (1,000 mLs Intravenous New Bag/Given 07/15/21 1426)  acetaminophen (TYLENOL) tablet 325 mg (325 mg Oral Given 07/15/21 1430)  metoCLOPramide (REGLAN) injection 10 mg (10 mg Intravenous Given 07/15/21 1428)  diphenhydrAMINE (BENADRYL) capsule 25 mg (25 mg Oral Given 07/15/21 1429)  ipratropium-albuterol (DUONEB) 0.5-2.5 (3) MG/3ML nebulizer solution 3 mL (3 mLs Nebulization Given 07/15/21 1435)  albuterol (PROVENTIL) (2.5 MG/3ML) 0.083% nebulizer solution 2.5 mg (2.5 mg Nebulization Given 07/15/21 1435)  methylPREDNISolone sodium succinate (SOLU-MEDROL) 125 mg/2 mL injection 125 mg (125 mg Intravenous Given 07/15/21 1427)    ED Course/ Medical Decision Making/ A&P Clinical Course as of 07/15/21 1920  Mon Jul 15, 2021  1919 I reevaluated patient multiple times during his ER visit.  At this most recent reevaluation at [place] patient feels that his breathing is much improved.  Plan to discharge home with strict Tylenol every 6 hours 1000 mg, Zofran for nausea, albuterol inhaler to use as needed and steroids with close follow-up with PCP and very strict return precautions. [WF]    Clinical Course User Index [WF] Tedd Sias, PA                           Medical Decision Making Amount and/or Complexity of Data Reviewed Labs: ordered. Radiology: ordered.  Risk OTC drugs. Prescription drug management.     This patient presents to the ED for concern of SOB this involves a number of treatment options, and is a  complaint that carries with it a high risk of complications and morbidity.  The differential diagnosis includes The causes for shortness of breath include but are not limited to Cardiac (AHF, pericardial effusion and tamponade, arrhythmias, ischemia, etc) Respiratory (COPD, asthma, pneumonia, pneumothorax, primary pulmonary hypertension, PE/VQ mismatch) Hematological (anemia) Neuromuscular (ALS, Guillain-Barr, etc)    Co morbidities: Discussed in HPI   Brief History:  Patient was diagnosed with influenza A earlier today.  Symptoms began  3 days ago have been persistently worsening since  Physical exam notable for tachypnea, tachycardia, fever, patient is unwell appearing initially.  I reviewed note from urgent care.  Seems that he was given 650 of Tylenol earlier at 11 AM.  Will give 325 for 1 complete dose.  Will redose as soon as possible.  Given 1 dose of ibuprofen, 2 L normal saline bolus.  Doubt sepsis/pneumonia.  Suspect this is all related to his fever.  Lactic within normal limits at 1.3 CBC without leukocytosis or anemia.  BNP unremarkable.  BMP - mild hyponatremia, hypochloremia consistent with dehydration.  EMR reviewed including pt PMHx, past surgical history and past visits to ER.   See HPI for more details   Lab Tests:  I ordered and independently interpreted labs.  The pertinent results include:    Labs notable for definitely without leukocytosis although patient does have some borderline erythrocytosis and borderline leukocytosis.  BMP notable for hyponatremia and hypochloremia creatinine slightly bumped from baseline but does not have evidence of an AKI.  BNP within normal limits and lactic without elevation.  I suspect that his tachycardia and tachypnea are due to fever and dehydration.   Imaging Studies:  NAD. I personally reviewed all imaging studies and no acute abnormality found. I agree with radiology interpretation. Chest x-ray without  infiltrate   Cardiac Monitoring:  The patient was maintained on a cardiac monitor.  I personally viewed and interpreted the cardiac monitored which showed an underlying rhythm of: Sinus tachycardia EKG non-ischemic sinus tachycardia.  Some T wave inversions may be due to rapid heart rate.  Patient does not have any chest pain.   Medicines ordered:  I ordered medication including 2 L normal saline, albuterol for wheezing, Reglan and Benadryl for headache and nausea, Solu-Medrol for wheezing Tylenol and ibuprofen for fever  Reevaluation of the patient after these medicines showed that the patient improved I have reviewed the patients home medicines and have made adjustments as needed   Critical Interventions:  Vitals:   07/15/21 1800 07/15/21 1840 07/15/21 1850 07/15/21 1900  BP: 136/88 (!) 158/92  (!) 150/101  Pulse: (!) 115 (!) 112  (!) 108  Temp:   98.3 F (36.8 C)   Resp: (!) 21 (!) 21  18  Height:      Weight:      SpO2: 98% 99%  98%  TempSrc:   Oral   BMI (Calculated):          Consults:      Reevaluation:  After the interventions noted above I re-evaluated patient and found that they have :improved   Social Determinants of Health:  The patient's social determinants of health were a factor in the care of this patient    Problem List / ED Course:  INFLUENZA A NAUSEA HEADACHE WHEEZING  On my reevaluation patient is no longer wheezing states that his work of breathing is significantly improved he does not feel short of breath any longer.  Fever has resolved with antipyretics and tachycardia has improved heart rate now low 100s   Discussed admission however patient is tolerating p.o. and states he would prefer to be discharged home.  Dispostion:  After consideration of the diagnostic results and the patients response to treatment, I feel that the patent would benefit from discharge home with strict Tylenol recommendations, prednisone, MDI albuterol  inhaler, Zofran for nausea and close follow-up with PCP  Very strict return precautions given.  Patient agreeable to plan.  Final Clinical  Impression(s) / ED Diagnoses Final diagnoses:  None    Rx / DC Orders ED Discharge Orders     None         Tedd Sias, [place] 07/15/21 2251    Lennice Sites, DO 07/16/21 1420

## 2021-07-15 NOTE — ED Notes (Signed)
EMT-P provided AVS using Teachback Method. Patient verbalizes understanding of Discharge Instructions. Opportunity for Questioning and Answers were provided by EMT-P. Patient Discharged from ED.  ? ?

## 2021-07-15 NOTE — ED Notes (Signed)
Pt SpO2 during ambulation was 96-95% Heart rate 120-125

## 2021-07-15 NOTE — ED Triage Notes (Signed)
Patient here POV from Home with SOB.  Patient endorses feeling ill for approximately 2 days including Cough, Fever, SOB, Headaches.  Went to UC today and tested Positive for Flu. Instructed to seek ED Evaluation due to Tachypnea.   Tylenol given at 1100 by UC.  Uncomfortable during Triage. A&Ox4. GCS 15. Ambulatory.

## 2021-07-15 NOTE — Discharge Instructions (Signed)
Please do plenty of water, Gatorade, Pedialyte  Take Tylenol 1000 mg every 6 hours on the clock.  Is very important to continue to take this.  You may wean off this if your fevers do not recur.  Zofran as needed for nausea  Please take the prednisone as prescribed start with first dose tomorrow morning.  Please follow-up closely for primary care provider  Return to the ER for any new or concerning symptoms  Use albuterol 4 puffs every 4 hours as needed for shortness of breath.

## 2021-07-15 NOTE — ED Triage Notes (Signed)
2 days cough, nasal congestion, headache, cramps, SOB, chest pain, fever. Has not taken anything to help

## 2022-10-08 ENCOUNTER — Telehealth: Payer: Self-pay

## 2022-10-08 NOTE — Telephone Encounter (Signed)
LVM for patient to call back. AS, CMA 

## 2022-12-28 ENCOUNTER — Encounter (HOSPITAL_COMMUNITY): Payer: Self-pay

## 2022-12-28 ENCOUNTER — Other Ambulatory Visit: Payer: Self-pay

## 2022-12-28 ENCOUNTER — Emergency Department (HOSPITAL_COMMUNITY)
Admission: EM | Admit: 2022-12-28 | Discharge: 2022-12-28 | Disposition: A | Discharge status: Home / Self Care | Payer: Medicaid Other | Attending: Emergency Medicine | Admitting: Emergency Medicine

## 2022-12-28 DIAGNOSIS — R000 Tachycardia, unspecified: Secondary | ICD-10-CM | POA: Diagnosis not present

## 2022-12-28 DIAGNOSIS — Z7982 Long term (current) use of aspirin: Secondary | ICD-10-CM | POA: Insufficient documentation

## 2022-12-28 DIAGNOSIS — I10 Essential (primary) hypertension: Secondary | ICD-10-CM | POA: Diagnosis not present

## 2022-12-28 DIAGNOSIS — Z79899 Other long term (current) drug therapy: Secondary | ICD-10-CM | POA: Diagnosis not present

## 2022-12-28 DIAGNOSIS — R040 Epistaxis: Secondary | ICD-10-CM

## 2022-12-28 LAB — CBC
HCT: 46.6 % (ref 39.0–52.0)
Hemoglobin: 15.1 g/dL (ref 13.0–17.0)
MCH: 30.4 pg (ref 26.0–34.0)
MCHC: 32.4 g/dL (ref 30.0–36.0)
MCV: 93.8 fL (ref 80.0–100.0)
Platelets: 225 10*3/uL (ref 150–400)
RBC: 4.97 MIL/uL (ref 4.22–5.81)
RDW: 13.8 % (ref 11.5–15.5)
WBC: 6.2 10*3/uL (ref 4.0–10.5)
nRBC: 0 % (ref 0.0–0.2)

## 2022-12-28 LAB — BASIC METABOLIC PANEL
Anion gap: 12 (ref 5–15)
BUN: 11 mg/dL (ref 6–20)
CO2: 22 mmol/L (ref 22–32)
Calcium: 8.8 mg/dL — ABNORMAL LOW (ref 8.9–10.3)
Chloride: 102 mmol/L (ref 98–111)
Creatinine, Ser: 1.15 mg/dL (ref 0.61–1.24)
GFR, Estimated: >60 mL/min (ref >60)
Glucose, Bld: 104 mg/dL — ABNORMAL HIGH (ref 70–99)
Potassium: 3.9 mmol/L (ref 3.5–5.1)
Sodium: 136 mmol/L (ref 135–145)

## 2022-12-28 MED ORDER — SODIUM CHLORIDE 0.9 % IV BOLUS
1000.0000 mL | Freq: Once | INTRAVENOUS | Status: AC
  Administered 2022-12-28: 1000 mL via INTRAVENOUS

## 2022-12-28 NOTE — ED Provider Notes (Signed)
[place] EMERGENCY DEPARTMENT AT [facility] Provider Note   CSN: [id number] Arrival date & time: 12/28/22  6283     History  Chief Complaint  Patient presents with   Epistaxis   HPI Craig Silva is a 32 y.o. male with history of hypertension presenting for epistaxis.  States last night he snorted cocaine and both naris around 8 PM.  Around 10 PM he started to bleed profusely.  States that blood was "everywhere".  EMS was called.  Bleeding was controlled around 12:30 AM this morning per patient with direct pressure and ABD pads.  Denies vasculopathy.  Denies shortness of breath, lightheadedness or fatigue.  Denies chest pain.  States this has happened before after using cocaine.    Epistaxis      Home Medications Prior to Admission medications   Medication Sig Start Date End Date Taking? Authorizing Provider  amLODipine (NORVASC) 5 MG tablet Take 1 tablet (5 mg total) by mouth daily. 05/28/19 05/27/20  Rolly Salter, MD  aspirin EC 81 MG EC tablet Take 1 tablet (81 mg total) by mouth daily. 05/29/19   Rolly Salter, MD  ondansetron (ZOFRAN) 4 MG tablet Take 1 tablet (4 mg total) by mouth every 6 (six) hours. Patient not taking: Reported on 05/28/2019 04/17/19   Couture, Cortni S, PA-C  ondansetron (ZOFRAN-ODT) 4 MG disintegrating tablet Take 1 tablet (4 mg total) by mouth every 8 (eight) hours as needed for nausea or vomiting. 07/15/21   Gailen Shelter, PA  thiamine 100 MG tablet Take 1 tablet (100 mg total) by mouth daily. 05/28/19   Rolly Salter, MD      Allergies    Patient has no known allergies.    Review of Systems   Review of Systems  HENT:  Positive for nosebleeds.     Physical Exam Updated Vital Signs BP 127/64 (BP Location: Left Arm)   Pulse 95   Temp 98.6 F (37 C) (Oral)   Resp (!) 22   Ht 5\' 6"  (1.676 m)   Wt 104.3 kg   SpO2 95%   BMI 37.12 kg/m  Physical Exam Vitals and nursing note reviewed.  HENT:     Head: Normocephalic  and atraumatic.     Nose:     Right Nostril: Epistaxis present. No septal hematoma.     Left Nostril: Epistaxis present. No septal hematoma.     Comments: Bleeding noted    Mouth/Throat:     Mouth: Mucous membranes are moist.     Comments: Old blood noted in the posterior nasopharynx Eyes:     General:        Right eye: No discharge.        Left eye: No discharge.     Conjunctiva/sclera: Conjunctivae normal.  Cardiovascular:     Rate and Rhythm: Normal rate and regular rhythm.     Pulses: Normal pulses.     Heart sounds: Normal heart sounds.  Pulmonary:     Effort: Pulmonary effort is normal.     Breath sounds: Normal breath sounds.  Abdominal:     General: Abdomen is flat.     Palpations: Abdomen is soft.  Skin:    General: Skin is warm and dry.  Neurological:     General: No focal deficit present.  Psychiatric:        Mood and Affect: Mood normal.     ED Results / Procedures / Treatments   Labs (all labs ordered are  listed, but only abnormal results are displayed) Labs Reviewed  BASIC METABOLIC PANEL - Abnormal; Notable for the following components:      Result Value   Glucose, Bld 104 (*)    Calcium 8.8 (*)    All other components within normal limits  CBC    EKG None  Radiology No results found.  Procedures Procedures    Medications Ordered in ED Medications  sodium chloride 0.9 % bolus 1,000 mL (1,000 mLs Intravenous New Bag/Given 12/28/22 0758)    ED Course/ Medical Decision Making/ A&P                             Medical Decision Making Amount and/or Complexity of Data Reviewed Labs: ordered.   32 year old well-appearing male presenting for epistaxis.  Exam notable for blood in both nares, tachycardia, blood noted in the posterior oropharynx.  Bleeding controlled before encounter with ABD pads and direct pressure.  DDx includes posterior versus anterior epistaxis, symptomatic anemia, respiratory distress. Labs are reassuring.  Treated with 1 L  of normal saline.  Vitals improved.  Tachycardia could be related to recent cocaine ingestion and dehydration.  Observed for multiple hours and no active bleeding noted.  Advised to follow-up with ENT in 2 to 3 days.  Advised to DC NSAIDs and apply over-the-counter antibiotic ointment.  Discussed pertinent return precautions.  Vitals normalized.  Discharged home in good condition.        Final Clinical Impression(s) / ED Diagnoses Final diagnoses:  Epistaxis    Rx / DC Orders ED Discharge Orders     None         Gareth Eagle, PA-C 12/28/22 0910    Melene Plan, DO 12/28/22 [phone]

## 2022-12-28 NOTE — ED Triage Notes (Signed)
Pt BIBA after uncontrollable non-traumatic  nose bleed x2 hrs prior to the arrival of medics. Saturated 4 abd pads. ETOH and cocaine use on board.

## 2022-12-28 NOTE — Discharge Instructions (Signed)
Evaluation for your nosebleed is overall reassuring.  Vitals are stable and your bleeding seems to be well-controlled at this time.  Please follow-up with ENT in 2 to 3 days.  In the meantime recommend that you apply topical antibiotic ointment to your nose daily.  You can get this over-the-counter.  If your bleeding returns, he becomes short of breath, fatigued or lightheaded or any other concern please return emergency department further evaluation.

## 2022-12-31 ENCOUNTER — Telehealth: Payer: Self-pay | Admitting: Otolaryngology

## 2022-12-31 NOTE — Telephone Encounter (Signed)
This is one we need to ask why they are following up. I looked at the note and he had nose bleed, but did not have packing. So he would not need immediate follow up. He can be placed on the referral list. Thanks

## 2022-12-31 NOTE — Telephone Encounter (Signed)
Patient called with mother and said that patient was seen in [facility] ED 12/28/22 and wanted him to follow up with ENT within a few days. Should he be seen this week or next? Patients call back is [phone]

## 2023-01-02 ENCOUNTER — Other Ambulatory Visit: Payer: Self-pay

## 2023-01-02 ENCOUNTER — Encounter (HOSPITAL_COMMUNITY): Payer: Self-pay

## 2023-01-02 ENCOUNTER — Emergency Department (HOSPITAL_COMMUNITY)
Admission: EM | Admit: 2023-01-02 | Discharge: 2023-01-02 | Disposition: A | Discharge status: Home / Self Care | Payer: Medicaid Other | Attending: Emergency Medicine | Admitting: Emergency Medicine

## 2023-01-02 DIAGNOSIS — Z7982 Long term (current) use of aspirin: Secondary | ICD-10-CM | POA: Insufficient documentation

## 2023-01-02 DIAGNOSIS — Z23 Encounter for immunization: Secondary | ICD-10-CM | POA: Insufficient documentation

## 2023-01-02 DIAGNOSIS — Z79899 Other long term (current) drug therapy: Secondary | ICD-10-CM | POA: Diagnosis not present

## 2023-01-02 DIAGNOSIS — R040 Epistaxis: Secondary | ICD-10-CM | POA: Insufficient documentation

## 2023-01-02 DIAGNOSIS — I10 Essential (primary) hypertension: Secondary | ICD-10-CM | POA: Insufficient documentation

## 2023-01-02 DIAGNOSIS — R000 Tachycardia, unspecified: Secondary | ICD-10-CM | POA: Diagnosis not present

## 2023-01-02 MED ORDER — ACETAMINOPHEN 500 MG PO TABS
1000.0000 mg | ORAL_TABLET | Freq: Once | ORAL | Status: AC
  Administered 2023-01-02: 1000 mg via ORAL
  Filled 2023-01-02: qty 2

## 2023-01-02 MED ORDER — AMLODIPINE BESYLATE 5 MG PO TABS: 5.0000 mg | ORAL_TABLET | Freq: Every day | ORAL | 0 refills | Status: DC

## 2023-01-02 MED ORDER — TETANUS-DIPHTH-ACELL PERTUSSIS 5-2.5-18.5 LF-MCG/0.5 IM SUSY
0.5000 mL | PREFILLED_SYRINGE | Freq: Once | INTRAMUSCULAR | Status: AC
  Administered 2023-01-02: 0.5 mL via INTRAMUSCULAR
  Filled 2023-01-02: qty 0.5

## 2023-01-02 MED ORDER — TRANEXAMIC ACID FOR EPISTAXIS
500.0000 mg | Freq: Once | TOPICAL | Status: AC
  Administered 2023-01-02: 500 mg via TOPICAL
  Filled 2023-01-02: qty 10

## 2023-01-02 NOTE — ED Notes (Signed)
Discharge instructions reviewed with pt, follow up with ENT and PCP for BP management reviewed. Pt verbalized understanding, ambulatory from ED

## 2023-01-02 NOTE — ED Triage Notes (Signed)
Pt BIBA with c/o epistaxis x2hours. Pt stated the air does not work in the hotel room that he stays in and felt like he "had gotten too hot." Came in for same 5 days ago. HTN 218/90. Noncompliant with HTN regimen. Empty ETOH cans present on scene. Denies fever, pain N/V/D, or chills at this time.   HR 100, RR 20, CBG 126, GCS 15

## 2023-01-02 NOTE — Telephone Encounter (Signed)
I called and lvm to see if patient wants to schedule

## 2023-01-02 NOTE — ED Provider Notes (Signed)
[place] EMERGENCY DEPARTMENT AT [facility] Provider Note   CSN: [id number] Arrival date & time: 01/02/23  1636     History  No chief complaint on file.   Craig Silva is a 32 y.o. male.  The history is provided by the patient and medical records. No language interpreter was used.     32 year old male with significant history of cocaine abuse presenting for evaluation of nosebleed.  Patient reports he was seen in the ED 4 days ago after he developed nosebleed primarily coming from his left nares.  While in the ED, bleeding subsequently stopped and patient was monitored for period of time and sent home.  Since then he has had intermittent bleeding coming from the left side of his nose but today it became more intense prompting this ER visit.  He did admits to snorting cocaine prior to his first bleeding.  He denies any trauma.  He does not endorse any fever or chills no lightheadedness or dizziness.  He is unsure of his last tetanus status.  Home Medications Prior to Admission medications   Medication Sig Start Date End Date Taking? Authorizing Provider  amLODipine (NORVASC) 5 MG tablet Take 1 tablet (5 mg total) by mouth daily. 05/28/19 05/27/20  Rolly Salter, MD  aspirin EC 81 MG EC tablet Take 1 tablet (81 mg total) by mouth daily. 05/29/19   Rolly Salter, MD  ondansetron (ZOFRAN) 4 MG tablet Take 1 tablet (4 mg total) by mouth every 6 (six) hours. Patient not taking: Reported on 05/28/2019 04/17/19   Couture, Cortni S, PA-C  ondansetron (ZOFRAN-ODT) 4 MG disintegrating tablet Take 1 tablet (4 mg total) by mouth every 8 (eight) hours as needed for nausea or vomiting. 07/15/21   Gailen Shelter, PA  thiamine 100 MG tablet Take 1 tablet (100 mg total) by mouth daily. 05/28/19   Rolly Salter, MD      Allergies    Patient has no known allergies.    Review of Systems   Review of Systems  All other systems reviewed and are negative.   Physical Exam Updated  Vital Signs BP (!) 196/113   Pulse 97   Temp 97.6 F (36.4 C)   Resp 19   Ht 5\' 6"  (1.676 m)   Wt 113.4 kg   SpO2 98%   BMI 40.35 kg/m  Physical Exam Vitals and nursing note reviewed.  Constitutional:      General: He is not in acute distress.    Appearance: He is well-developed.  HENT:     Head: Atraumatic.     Nose:     Comments: Dried blood noted to bilateral nares without any active bleeding.  No signs of trauma.  No septal ulceration or erosion.    Mouth/Throat:     Mouth: Mucous membranes are moist.  Eyes:     Conjunctiva/sclera: Conjunctivae normal.  Musculoskeletal:     Cervical back: Neck supple.  Skin:    Findings: No rash.  Neurological:     Mental Status: He is alert.     ED Results / Procedures / Treatments   Labs (all labs ordered are listed, but only abnormal results are displayed) Labs Reviewed - No data to display  EKG None  Radiology No results found.  Procedures .Epistaxis Management  Date/Time: 01/02/2023 5:30 PM  Performed by: Fayrene Helper, PA-C Authorized by: Fayrene Helper, PA-C   Consent:    Consent obtained:  Verbal   Consent given  by:  Patient   Risks, benefits, and alternatives were discussed: yes     Risks discussed:  Nasal injury, pain, infection and bleeding   Alternatives discussed:  Alternative treatment Universal protocol:    Procedure explained and questions answered to patient or proxy's satisfaction: yes     Patient identity confirmed:  Verbally with patient and arm band Anesthesia:    Anesthesia method:  None Procedure details:    Treatment site:  L posterior   Repair method: rapid rhino.   Treatment complexity:  Limited   Treatment episode: initial   Post-procedure details:    Assessment:  Bleeding stopped   Procedure completion:  Tolerated well, no immediate complications Comments:     Rapid rhino was soaked in TXA     Medications Ordered in ED Medications  tranexamic acid (CYKLOKAPRON) 1000 MG/10ML topical  solution 500 mg (500 mg Topical Given 01/02/23 1729)  Tdap (BOOSTRIX) injection 0.5 mL (0.5 mLs Intramuscular Given 01/02/23 1727)  acetaminophen (TYLENOL) tablet 1,000 mg (1,000 mg Oral Given 01/02/23 1801)    ED Course/ Medical Decision Making/ A&P                                 Medical Decision Making Risk OTC drugs. Prescription drug management.   BP (!) 196/113   Pulse 97   Temp 97.6 F (36.4 C)   Resp 19   Ht 5\' 6"  (1.676 m)   Wt 113.4 kg   SpO2 98%   BMI 40.35 kg/m   43:16 PM   32 year old male with significant history of cocaine abuse presenting for evaluation of nosebleed.  Patient reports he was seen in the ED 4 days ago after he developed nosebleed primarily coming from his left nares.  While in the ED, bleeding subsequently stopped and patient was monitored for period of time and sent home.  Since then he has had intermittent bleeding coming from the left side of his nose but today it became more intense prompting this ER visit.  He did admits to snorting cocaine prior to his first bleeding.  He denies any trauma.  He does not endorse any fever or chills no lightheadedness or dizziness.  He is unsure of his last tetanus status.  On exam patient is laying in bed, his shirt is soaked with blood.  Epistaxis appears to have stopped after no active bleeding at this time.  No obvious source of active bleeding noted on nasal exam.  Due to his recurrent nosebleed, and after discussion, patient agrees with nasal tampon to for better control.  I was able to successfully place a rapid Rhino to the left nares and patient having monitored for the past 2 hours without recurrence of bleeding.  Blood pressure is elevated today with a current blood pressure of 196/113.  Patient does have known history of hypertension but not compliant with his medication.  Plan to prescribe blood pressure medication for his poorly controlled hypertension and strongly encourage patient to follow-up with PCP for  further management of his high blood pressure.  I will also provide patient with referral to ENT for close follow-up now that he has nasal packing.  I also encouraged patient to avoid cocaine use for which he agrees.  I gave patient return precaution.        Final Clinical Impression(s) / ED Diagnoses Final diagnoses:  Left-sided epistaxis  Poorly-controlled hypertension    Rx / DC Orders  ED Discharge Orders          Ordered    amLODipine (NORVASC) 5 MG tablet  Daily        01/02/23 1920              Fayrene Helper, PA-C 01/02/23 1921    Charlynne Pander, MD 01/02/23 [phone]

## 2023-01-02 NOTE — Discharge Instructions (Signed)
You have been evaluated and managed for your nosebleed.  A nasal packing was placed.  Please call and follow-up closely with the ear nose and throat specialist early next week for reassessment.  Your blood pressure is quite high today.  Make sure to take blood pressure medication and have it rechecked by your doctor.  You may take over-the-counter Tylenol as needed for headache or for pain.  Return if you have any concern.

## 2023-01-09 ENCOUNTER — Emergency Department (HOSPITAL_COMMUNITY)
Admission: EM | Admit: 2023-01-09 | Discharge: 2023-01-09 | Disposition: A | Discharge status: Home / Self Care | Payer: Medicaid Other | Attending: Emergency Medicine | Admitting: Emergency Medicine

## 2023-01-09 ENCOUNTER — Encounter (HOSPITAL_COMMUNITY): Payer: Self-pay

## 2023-01-09 ENCOUNTER — Other Ambulatory Visit: Payer: Self-pay

## 2023-01-09 DIAGNOSIS — R040 Epistaxis: Secondary | ICD-10-CM

## 2023-01-09 DIAGNOSIS — Z79899 Other long term (current) drug therapy: Secondary | ICD-10-CM | POA: Insufficient documentation

## 2023-01-09 DIAGNOSIS — I10 Essential (primary) hypertension: Secondary | ICD-10-CM | POA: Diagnosis not present

## 2023-01-09 DIAGNOSIS — Z7982 Long term (current) use of aspirin: Secondary | ICD-10-CM | POA: Insufficient documentation

## 2023-01-09 MED ORDER — BACITRACIN ZINC 500 UNIT/GM EX OINT: 1.0000 | TOPICAL_OINTMENT | Freq: Two times a day (BID) | CUTANEOUS | 0 refills | Status: AC

## 2023-01-09 MED ORDER — OXYMETAZOLINE HCL 0.05 % NA SOLN: 1.0000 | NASAL | 0 refills | Status: AC | PRN

## 2023-01-09 MED ORDER — OXYMETAZOLINE HCL 0.05 % NA SOLN
1.0000 | Freq: Once | NASAL | Status: AC
  Administered 2023-01-09: 1 via NASAL
  Filled 2023-01-09: qty 30

## 2023-01-09 NOTE — ED Notes (Signed)
Pt was given water. 

## 2023-01-09 NOTE — ED Provider Notes (Signed)
[place] EMERGENCY DEPARTMENT AT [facility] Provider Note   CSN: [id number] Arrival date & time: 01/09/23  1813     History  Chief Complaint  Patient presents with   Epistaxis    Craig Silva is a 32 y.o. male with a history of hypertension who presents to the ED today for nosebleed.  Patient reports that he was laying down in bed when both of his nostrils started bleeding.  He reports a steady stream of blood for several hours.  He was not able to stop the bleeding at home so he called EMS to bring him to the ED tonight.  He has had two other episodes of epistaxis within the past 2 weeks. He denies snorting drugs since the last nose bleed. No other trauma to the nose. Patient is scheduled follow-up with me in the next month for further evaluation.  No other complaints or concerns at this time.    Home Medications Prior to Admission medications   Medication Sig Start Date End Date Taking? Authorizing Provider  amLODipine (NORVASC) 5 MG tablet Take 1 tablet (5 mg total) by mouth daily. 01/02/23 01/02/24 Yes Fayrene Helper, PA-C  bacitracin ointment Apply 1 Application topically 2 (two) times daily for 7 days. Apply to nostrils 01/09/23 01/16/23 Yes Maxwell Marion, PA-C  oxymetazoline (AFRIN NASAL SPRAY) 0.05 % nasal spray Place 1 spray into both nostrils as needed for up to 14 days for congestion (For nose bleeds). 01/09/23 01/23/23 Yes Maxwell Marion, PA-C  aspirin EC 81 MG EC tablet Take 1 tablet (81 mg total) by mouth daily. Patient not taking: Reported on 01/09/2023 05/29/19   Rolly Salter, MD  ondansetron (ZOFRAN) 4 MG tablet Take 1 tablet (4 mg total) by mouth every 6 (six) hours. Patient not taking: Reported on 05/28/2019 04/17/19   Couture, Cortni S, PA-C  ondansetron (ZOFRAN-ODT) 4 MG disintegrating tablet Take 1 tablet (4 mg total) by mouth every 8 (eight) hours as needed for nausea or vomiting. Patient not taking: Reported on 01/09/2023 07/15/21   Gailen Shelter, PA  thiamine  100 MG tablet Take 1 tablet (100 mg total) by mouth daily. Patient not taking: Reported on 01/09/2023 05/28/19   Rolly Salter, MD      Allergies    Patient has no known allergies.    Review of Systems   Review of Systems  HENT:  Positive for nosebleeds.   All other systems reviewed and are negative.   Physical Exam Updated Vital Signs BP (!) 167/110   Pulse (!) 58   Temp 98.4 F (36.9 C) (Oral)   Resp 18   Ht 5\' 6"  (1.676 m)   Wt 113.4 kg   SpO2 100%   BMI 40.35 kg/m  Physical Exam Vitals and nursing note reviewed.  Constitutional:      Appearance: Normal appearance.  HENT:     Head: Normocephalic and atraumatic.     Nose:     Comments: Dried blood present at the lateral sides of both nostrils. Mucosa at the septum is unremarkable. No perforation of the septum.    Mouth/Throat:     Mouth: Mucous membranes are moist.     Comments: Some streaks of blood present at the back of the throat Eyes:     Conjunctiva/sclera: Conjunctivae normal.     Pupils: Pupils are equal, round, and reactive to light.  Cardiovascular:     Rate and Rhythm: Normal rate and regular rhythm.     Pulses:  Normal pulses.  Pulmonary:     Effort: Pulmonary effort is normal.     Breath sounds: Normal breath sounds.  Abdominal:     Palpations: Abdomen is soft.     Tenderness: There is no abdominal tenderness.  Skin:    General: Skin is warm and dry.     Findings: No rash.  Neurological:     General: No focal deficit present.     Mental Status: He is alert.  Psychiatric:        Mood and Affect: Mood normal.        Behavior: Behavior normal.     ED Results / Procedures / Treatments   Labs (all labs ordered are listed, but only abnormal results are displayed) Labs Reviewed - No data to display  EKG None  Radiology No results found.  Procedures Procedures: not indicated.   Medications Ordered in ED Medications  oxymetazoline (AFRIN) 0.05 % nasal spray 1 spray (1 spray Each Nare  Given 01/09/23 1909)    ED Course/ Medical Decision Making/ A&P                                 Medical Decision Making Risk OTC drugs.   This patient presents to the ED for concern of nose bleed, this involves an extensive number of treatment options, and is a complaint that carries with it a high risk of complications and morbidity.   Differential diagnosis includes: unilateral vs bilateral epistaxis, anterior vs posterior epistaxis, etc.   Co morbidities that complicate the patient evaluation  Hypertension   Additional history  Additional history obtained from previous ED records.   Problem List / ED Course / Critical interventions / Medication management  Bilateral epistaxis Afrin was ordered and stopped the bleeding in both nostrils. At time of evaluation, the bleeding had stopped and patient was feeling better. I have reviewed the patients home medicines and have made adjustments as needed.   Social Determinants of Health  Access to healthcare   Test / Admission - Considered  Since patient's bleeding is controlled at time of evaluation, no further workup is needed.  Vitals are stable. Patient is safe for discharge home. Prescriptions for Bacitracin and Afrin sent to pharmacy. Follow up with ENT in one month as scheduled. Return precautions provided.       Final Clinical Impression(s) / ED Diagnoses Final diagnoses:  Epistaxis    Rx / DC Orders ED Discharge Orders          Ordered    oxymetazoline (AFRIN NASAL SPRAY) 0.05 % nasal spray  As needed        01/09/23 2125    bacitracin ointment  2 times daily        01/09/23 2125              Maxwell Marion, PA-C 01/09/23 2159    Sloan Leiter, DO 01/10/23 2255

## 2023-01-09 NOTE — ED Triage Notes (Signed)
Pt presents with a nose bleed x1 hour PTA. Pt states this is recurrent and has been to the ED 3 other times and recently had nasal packing. Denies anticoagulants.

## 2023-01-09 NOTE — Discharge Instructions (Addendum)
As discussed, please do not stick anything up your nose as your nasal mucosa is friable and can bleed easily at the moment. I have sent a prescription of Bacitracin to your pharmacy to apply to your nostrils to help heal the lining. I have also sent in Afrin to use as needed for nose bleeds.  You can also get dehumidifier for you room which can help with that nosebleeds as well.  Please keep follow-up appointment with ENT next month for further evaluation of your symptoms.  Get help right away if: You have a nosebleed after you fall or hurt your head. Your nosebleed does not go away after 20 minutes. You feel dizzy or weak. You have unusual bleeding from other parts of your body. You have unusual bruising on other parts of your body. You get sweaty. You vomit blood.

## 2023-01-09 NOTE — ED Notes (Signed)
Bleeding controlled at this time.

## 2023-01-15 ENCOUNTER — Other Ambulatory Visit: Payer: Self-pay

## 2023-01-15 ENCOUNTER — Emergency Department (HOSPITAL_COMMUNITY)
Admission: EM | Admit: 2023-01-15 | Discharge: 2023-01-15 | Disposition: A | Discharge status: Home / Self Care | Payer: Medicaid Other | Attending: Emergency Medicine | Admitting: Emergency Medicine

## 2023-01-15 DIAGNOSIS — I959 Hypotension, unspecified: Secondary | ICD-10-CM | POA: Diagnosis not present

## 2023-01-15 DIAGNOSIS — I10 Essential (primary) hypertension: Secondary | ICD-10-CM | POA: Diagnosis not present

## 2023-01-15 DIAGNOSIS — R040 Epistaxis: Secondary | ICD-10-CM | POA: Insufficient documentation

## 2023-01-15 DIAGNOSIS — R58 Hemorrhage, not elsewhere classified: Secondary | ICD-10-CM | POA: Diagnosis not present

## 2023-01-15 DIAGNOSIS — Z7982 Long term (current) use of aspirin: Secondary | ICD-10-CM | POA: Diagnosis not present

## 2023-01-15 DIAGNOSIS — R0789 Other chest pain: Secondary | ICD-10-CM | POA: Diagnosis not present

## 2023-01-15 DIAGNOSIS — R42 Dizziness and giddiness: Secondary | ICD-10-CM | POA: Diagnosis not present

## 2023-01-15 DIAGNOSIS — R000 Tachycardia, unspecified: Secondary | ICD-10-CM | POA: Diagnosis not present

## 2023-01-15 DIAGNOSIS — Z79899 Other long term (current) drug therapy: Secondary | ICD-10-CM | POA: Insufficient documentation

## 2023-01-15 LAB — HEMOGLOBIN AND HEMATOCRIT, BLOOD
HCT: 42.5 % (ref 39.0–52.0)
Hemoglobin: 13.9 g/dL (ref 13.0–17.0)

## 2023-01-15 MED ORDER — HYDRALAZINE HCL 20 MG/ML IJ SOLN: 5.0000 mg | Freq: Once | INTRAMUSCULAR | Status: DC

## 2023-01-15 MED ORDER — LIDOCAINE-EPINEPHRINE 1 %-1:100000 IJ SOLN
20.0000 mL | Freq: Once | INTRAMUSCULAR | Status: AC
  Administered 2023-01-15: 20 mL
  Filled 2023-01-15: qty 1

## 2023-01-15 MED ORDER — LABETALOL HCL 5 MG/ML IV SOLN
20.0000 mg | Freq: Once | INTRAVENOUS | Status: AC
  Administered 2023-01-15: 20 mg via INTRAVENOUS
  Filled 2023-01-15: qty 4

## 2023-01-15 MED ORDER — ONDANSETRON 4 MG PO TBDP
4.0000 mg | ORAL_TABLET | Freq: Once | ORAL | Status: AC
  Administered 2023-01-15: 4 mg via ORAL
  Filled 2023-01-15: qty 1

## 2023-01-15 MED ORDER — OXYMETAZOLINE HCL 0.05 % NA SOLN
1.0000 | Freq: Once | NASAL | Status: AC
  Administered 2023-01-15: 1 via NASAL
  Filled 2023-01-15: qty 30

## 2023-01-15 NOTE — ED Notes (Signed)
Pt has frank red blood pouring from both nostrils. Paden, ED RN applied pressure and set the pt up with suction yankauer.

## 2023-01-15 NOTE — ED Triage Notes (Signed)
Per EMS and pt report, pt started having a nose bleed around 1pm today. Applied pressure but it didn't stop. Pt has had 4 episodes in the last 3 weeks. Has been taking his BP meds as prescribed. Pt tried Afrin before the EMS arrived with no help. EMS did 2 sprays of Afrin in each nostril with no help. Pt also started experiencing chest pain in the mid to right. Chest pain has resolved. Not on blood thinners. Also had 324mg  aspirin.

## 2023-01-15 NOTE — ED Notes (Signed)
Direct pressure applied to patient nose, patient also provided suction Yankauer for removing blood from nose and mouth.

## 2023-01-15 NOTE — ED Notes (Signed)
This RN reviewed discharge instructions with patient. He verbalized understanding and denied any further questions. PT well appearing upon discharge and reports tolerable pain. Pt ambulated with stable gait to exit. Pt endorses ride home.  

## 2023-01-15 NOTE — Discharge Instructions (Addendum)
I have given you a referral to [facility] for primary care.  I would establish care with them and get your blood pressure under better control as this will likely help your nosebleeds.  As we discussed, it is very important you stay away from cocaine as well.  You may return to the emergency department for any worsening symptoms.

## 2023-01-15 NOTE — ED Provider Notes (Addendum)
[place] EMERGENCY DEPARTMENT AT [facility] Provider Note   CSN: [id number] Arrival date & time: 01/15/23  1353     History Chief Complaint  Patient presents with   Epistaxis    Craig Silva is a 32 y.o. male patient with history of hypertension on amlodipine who presents to the emergency department today for further evaluation of epistaxis that started 2 hours ago and has been constant.  Per chart review, patient has been seen and evaluated here numerous times for epistaxis.  Patient has a follow-up appointment in September.  This was started spontaneously.  He denies any recent illness.  He has been taking his amlodipine regularly and has not missed any doses.  Bleeding is not controlled with direct pressure which prompted his arrival to the ED.  Patient is not anticoagulated.  He denies any chest pain or shortness of breath. He denies drug use specifically cocaine.    Epistaxis      Home Medications Prior to Admission medications   Medication Sig Start Date End Date Taking? Authorizing Provider  amLODipine (NORVASC) 5 MG tablet Take 1 tablet (5 mg total) by mouth daily. 01/02/23 01/02/24  Fayrene Helper, PA-C  aspirin EC 81 MG EC tablet Take 1 tablet (81 mg total) by mouth daily. Patient not taking: Reported on 01/09/2023 05/29/19   Rolly Salter, MD  bacitracin ointment Apply 1 Application topically 2 (two) times daily for 7 days. Apply to nostrils 01/09/23 01/16/23  Maxwell Marion, PA-C  ondansetron (ZOFRAN) 4 MG tablet Take 1 tablet (4 mg total) by mouth every 6 (six) hours. Patient not taking: Reported on 05/28/2019 04/17/19   Couture, Cortni S, PA-C  ondansetron (ZOFRAN-ODT) 4 MG disintegrating tablet Take 1 tablet (4 mg total) by mouth every 8 (eight) hours as needed for nausea or vomiting. Patient not taking: Reported on 01/09/2023 07/15/21   Gailen Shelter, PA  oxymetazoline (AFRIN NASAL SPRAY) 0.05 % nasal spray Place 1 spray into both nostrils as needed for up to 14  days for congestion (For nose bleeds). 01/09/23 01/23/23  Maxwell Marion, PA-C  thiamine 100 MG tablet Take 1 tablet (100 mg total) by mouth daily. Patient not taking: Reported on 01/09/2023 05/28/19   Rolly Salter, MD      Allergies    Patient has no known allergies.    Review of Systems   Review of Systems  HENT:  Positive for nosebleeds.   All other systems reviewed and are negative.   Physical Exam Updated Vital Signs BP (!) 146/98   Pulse 89   Temp 98.4 F (36.9 C) (Oral)   Resp (!) 25   Ht 5\' 6"  (1.676 m)   Wt 113.4 kg   SpO2 97%   BMI 40.35 kg/m  Physical Exam Vitals and nursing note reviewed.  Constitutional:      General: He is not in acute distress.    Appearance: Normal appearance.  HENT:     Head: Normocephalic and atraumatic.     Nose:     Comments: Moderate amount of brisk red blood coming from bilateral nare. Eyes:     General:        Right eye: No discharge.        Left eye: No discharge.  Cardiovascular:     Comments: Regular rate and rhythm.  S1/S2 are distinct without any evidence of murmur, rubs, or gallops.  Radial pulses are 2+ bilaterally.  Dorsalis pedis pulses are 2+ bilaterally.  No evidence  of pedal edema. Pulmonary:     Comments: Clear to auscultation bilaterally.  Normal effort.  No respiratory distress.  No evidence of wheezes, rales, or rhonchi heard throughout. Abdominal:     General: Abdomen is flat. Bowel sounds are normal. There is no distension.     Tenderness: There is no abdominal tenderness. There is no guarding or rebound.  Musculoskeletal:        General: Normal range of motion.     Cervical back: Neck supple.  Skin:    General: Skin is warm and dry.     Findings: No rash.  Neurological:     General: No focal deficit present.     Mental Status: He is alert.  Psychiatric:        Mood and Affect: Mood normal.        Behavior: Behavior normal.     ED Results / Procedures / Treatments   Labs (all labs ordered are listed,  but only abnormal results are displayed) Labs Reviewed  HEMOGLOBIN AND HEMATOCRIT, BLOOD    EKG EKG Interpretation Date/Time:  Thursday January 15 2023 14:01:41 EDT Ventricular Rate:  118 PR Interval:  158 QRS Duration:  90 QT Interval:  323 QTC Calculation: 453 R Axis:   268  Text Interpretation: Sinus tachycardia Probable left atrial enlargement Markedly posterior QRS axis Confirmed by Vonita Moss [phone]) on 01/15/2023 2:16:38 PM  Radiology No results found.  Procedures .Epistaxis Management  Date/Time: 01/15/2023 5:28 PM  Performed by: Teressa Lower, PA-C Authorized by: Teressa Lower, PA-C   Consent:    Consent obtained:  Verbal   Consent given by:  Patient   Risks discussed:  Bleeding and infection Universal protocol:    Procedure explained and questions answered to patient or proxy's satisfaction: yes     Relevant documents present and verified: yes     Test results available: yes     Imaging studies available: no     Required blood products, implants, devices, and special equipment available: yes     Site/side marked: yes     Immediately prior to procedure, a time out was called: no     Patient identity confirmed:  Verbally with patient and arm band Anesthesia:    Anesthesia method:  Topical application   Topical anesthetic:  Epinephrine and lidocaine gel Procedure details:    Treatment site:  Unable to specify   Treatment method:  Nasal tampon   Treatment complexity:  Extensive   Treatment episode: recurring   Post-procedure details:    Assessment:  Bleeding stopped   Procedure completion:  Tolerated well, no immediate complications     Medications Ordered in ED Medications  oxymetazoline (AFRIN) 0.05 % nasal spray 1 spray (1 spray Each Nare Given 01/15/23 1425)  labetalol (NORMODYNE) injection 20 mg (20 mg Intravenous Given 01/15/23 1424)  ondansetron (ZOFRAN-ODT) disintegrating tablet 4 mg (4 mg Oral Given 01/15/23 1425)   lidocaine-EPINEPHrine (XYLOCAINE W/EPI) 1 %-1:100000 (with pres) injection 20 mL (20 mLs Other Given by Other 01/15/23 1640)    ED Course/ Medical Decision Making/ A&P Clinical Course as of 01/15/23 1728  Thu Jan 15, 2023  1448 Direct pressure and the IV labetalol has certainly slowed the flow of the epistaxis.  I added to cotton swabs soaked with Afrin and reapplied direct pressure.  Will evaluate in 15 minutes. [CF]  1547 On reevaluation, patient removed the packing and is now bleeding again primarily from the left nare.  Will plan to do an  anterior pack. [CF]  1633 I placed a lidocaine/epinephrine soaked gauze in the left nare after I had him blow out all the clots.  Will apply direct pressure and go from there. [CF]  1715 On reevaluation, patient is now epistaxis free. [CF]  1724 Hemoglobin and hematocrit, blood Normal. [CF]  1725 Patient still not bleeding.  Conservative measures were discussed with him the bedside.  I am also getting give him a referral for primary care to get his blood pressure under control this is likely a contributing factor.  Strict turn precautions were discussed.  He is safer discharge. [CF]    Clinical Course User Index [CF] Teressa Lower, PA-C   {   Click here for ABCD2, HEART and other calculators  Medical Decision Making Alezander I Gosey is a 32 y.o. male patient presents to the emergency department today for further evaluation of epistaxis.  Patient is hypertensive which could be the likely cause of this.  Patient is not anticoagulated.  Will start off with direct pressure but will plan to progress to cotton swab soaked Afrin and or chemical cautery if necessary.  Epistaxis was resolved with lidocaine epinephrine.  Strict turn precautions were discussed.  He will follow-up with primary care and ENT.  He is safer discharge.  Amount and/or Complexity of Data Reviewed Labs: ordered. Decision-making details documented in ED Course.  Risk OTC  drugs. Prescription drug management.    Final Clinical Impression(s) / ED Diagnoses Final diagnoses:  Epistaxis    Rx / DC Orders ED Discharge Orders     None         Teressa Lower, PA-C 01/15/23 1728    Honor Loh [place], PA-C 01/15/23 1729    Rondel Baton, MD 01/16/23 [phone]

## 2023-01-17 ENCOUNTER — Other Ambulatory Visit: Payer: Self-pay

## 2023-01-17 ENCOUNTER — Emergency Department (HOSPITAL_COMMUNITY)
Admission: EM | Admit: 2023-01-17 | Discharge: 2023-01-17 | Disposition: A | Discharge status: Home / Self Care | Payer: Medicaid Other | Attending: Emergency Medicine | Admitting: Emergency Medicine

## 2023-01-17 DIAGNOSIS — I10 Essential (primary) hypertension: Secondary | ICD-10-CM | POA: Diagnosis not present

## 2023-01-17 DIAGNOSIS — Z79899 Other long term (current) drug therapy: Secondary | ICD-10-CM | POA: Diagnosis not present

## 2023-01-17 DIAGNOSIS — Z7982 Long term (current) use of aspirin: Secondary | ICD-10-CM | POA: Insufficient documentation

## 2023-01-17 DIAGNOSIS — R040 Epistaxis: Secondary | ICD-10-CM | POA: Diagnosis not present

## 2023-01-17 DIAGNOSIS — G4489 Other headache syndrome: Secondary | ICD-10-CM | POA: Diagnosis not present

## 2023-01-17 DIAGNOSIS — D649 Anemia, unspecified: Secondary | ICD-10-CM | POA: Insufficient documentation

## 2023-01-17 DIAGNOSIS — R58 Hemorrhage, not elsewhere classified: Secondary | ICD-10-CM | POA: Diagnosis not present

## 2023-01-17 LAB — CBC WITH DIFFERENTIAL/PLATELET
Abs Immature Granulocytes: 0.04 10*3/uL (ref 0.00–0.07)
Basophils Absolute: 0 10*3/uL (ref 0.0–0.1)
Basophils Relative: 1 %
Eosinophils Absolute: 0 10*3/uL (ref 0.0–0.5)
Eosinophils Relative: 1 %
HCT: 36.4 % — ABNORMAL LOW (ref 39.0–52.0)
Hemoglobin: 12 g/dL — ABNORMAL LOW (ref 13.0–17.0)
Immature Granulocytes: 1 %
Lymphocytes Relative: 24 %
Lymphs Abs: 1.4 10*3/uL (ref 0.7–4.0)
MCH: 31 pg (ref 26.0–34.0)
MCHC: 33 g/dL (ref 30.0–36.0)
MCV: 94.1 fL (ref 80.0–100.0)
Monocytes Absolute: 0.4 10*3/uL (ref 0.1–1.0)
Monocytes Relative: 6 %
Neutro Abs: 4.2 10*3/uL (ref 1.7–7.7)
Neutrophils Relative %: 67 %
Platelets: 225 10*3/uL (ref 150–400)
RBC: 3.87 MIL/uL — ABNORMAL LOW (ref 4.22–5.81)
RDW: 13.7 % (ref 11.5–15.5)
WBC: 6.1 10*3/uL (ref 4.0–10.5)
nRBC: 0 % (ref 0.0–0.2)

## 2023-01-17 LAB — PROTIME-INR
INR: 0.9 (ref 0.8–1.2)
Prothrombin Time: 12.3 seconds (ref 11.4–15.2)

## 2023-01-17 LAB — APTT: aPTT: 26 seconds (ref 24–36)

## 2023-01-17 MED ORDER — LIDOCAINE-EPINEPHRINE-TETRACAINE (LET) TOPICAL GEL
3.0000 mL | Freq: Once | TOPICAL | Status: AC
  Administered 2023-01-17: 3 mL via TOPICAL
  Filled 2023-01-17: qty 3

## 2023-01-17 NOTE — ED Triage Notes (Signed)
Pt arrived from home via [facility] for nosebleed and hypertension.   BP 138/95 HR 54 RR16 O2 95%RA

## 2023-01-17 NOTE — Discharge Instructions (Addendum)
I am glad you are feeling better.  If epistaxis recurs, you can blow out the blood clots from your nose and spray Afrin into the affected nare. Then hold pressure for at least .  Seek emergency care if experiencing any new or worsening symptoms.  Please make sure to follow-up during your ENT appointment on Monday.

## 2023-01-17 NOTE — ED Provider Notes (Signed)
[place] EMERGENCY DEPARTMENT AT [facility] Provider Note   CSN: [id number] Arrival date & time: 01/17/23  1439     History  Chief Complaint  Patient presents with   Epistaxis    Craig Silva is a 32 y.o. male with PMHx HTN who presents to ED concerned for epistaxis. Patient seen in ED multiple times recently for same complaint. Patient was given Afrin on last ED visit but forgot medication at home and decided to come to ED. Denies trauma and blood thinners.    Epistaxis      Home Medications Prior to Admission medications   Medication Sig Start Date End Date Taking? Authorizing Provider  amLODipine (NORVASC) 5 MG tablet Take 1 tablet (5 mg total) by mouth daily. 01/02/23 01/02/24  Fayrene Helper, PA-C  aspirin EC 81 MG EC tablet Take 1 tablet (81 mg total) by mouth daily. Patient not taking: Reported on 01/09/2023 05/29/19   Rolly Salter, MD  ondansetron (ZOFRAN) 4 MG tablet Take 1 tablet (4 mg total) by mouth every 6 (six) hours. Patient not taking: Reported on 05/28/2019 04/17/19   Couture, Cortni S, PA-C  ondansetron (ZOFRAN-ODT) 4 MG disintegrating tablet Take 1 tablet (4 mg total) by mouth every 8 (eight) hours as needed for nausea or vomiting. Patient not taking: Reported on 01/09/2023 07/15/21   Gailen Shelter, PA  oxymetazoline (AFRIN NASAL SPRAY) 0.05 % nasal spray Place 1 spray into both nostrils as needed for up to 14 days for congestion (For nose bleeds). 01/09/23 01/23/23  Maxwell Marion, PA-C  thiamine 100 MG tablet Take 1 tablet (100 mg total) by mouth daily. Patient not taking: Reported on 01/09/2023 05/28/19   Rolly Salter, MD      Allergies    Patient has no known allergies.    Review of Systems   Review of Systems  HENT:  Positive for nosebleeds.     Physical Exam Updated Vital Signs BP (!) 156/95 (BP Location: Right Arm)   Pulse (!) 102   Temp 98.3 F (36.8 C) (Oral)   Resp 20   SpO2 100%  Physical Exam Vitals and nursing note  reviewed.  Constitutional:      General: He is not in acute distress.    Appearance: He is not ill-appearing or toxic-appearing.  HENT:     Head: Normocephalic and atraumatic.     Nose:     Comments: Mild anterior nose bleeding appreciated in left nare Eyes:     General: No scleral icterus.       Right eye: No discharge.        Left eye: No discharge.     Conjunctiva/sclera: Conjunctivae normal.  Cardiovascular:     Rate and Rhythm: Normal rate.  Pulmonary:     Effort: Pulmonary effort is normal.  Abdominal:     General: Abdomen is flat.  Skin:    General: Skin is warm and dry.  Neurological:     General: No focal deficit present.     Mental Status: He is alert. Mental status is at baseline.  Psychiatric:        Mood and Affect: Mood normal.        Behavior: Behavior normal.     ED Results / Procedures / Treatments   Labs (all labs ordered are listed, but only abnormal results are displayed) Labs Reviewed  CBC WITH DIFFERENTIAL/PLATELET - Abnormal; Notable for the following components:      Result Value  RBC 3.87 (*)    Hemoglobin 12.0 (*)    HCT 36.4 (*)    All other components within normal limits  APTT  PROTIME-INR    EKG None  Radiology No results found.  Procedures Procedures    Medications Ordered in ED Medications  lidocaine-EPINEPHrine-tetracaine (LET) topical gel (3 mLs Topical Given by Other 01/17/23 1511)    ED Course/ Medical Decision Making/ A&P                                 Medical Decision Making  This patient presents to the ED for concern of epistaxis, this involves an extensive number of treatment options, and is a complaint that carries with it a high risk of complications and morbidity.  The differential diagnosis includes bleeding disorder, epistaxis,    Co morbidities that complicate the patient evaluation  HTN   Lab Tests:  I Ordered, and personally interpreted labs.  The pertinent results include:   CBC: mild anemia  (12.0) APTT: within normal limits PT-INR: within normal limits    Problem List / ED Course / Critical interventions / Medication management  Patient presents to ED concerned for epistaxis. Patient seen multiple times in ED for same complaint. States that nose started bleeding while eating lunch. 3:30PM - had patient blow blood clot out of left nare. Mild bleeding appreciated. Placed packing soaked in LET and will reassess. 4:00PM removed packaging. No bleeding appreciated. Will reassess. CBC with mild anemia at 12.0. APTT and PT-INR within normal limits. Patient declines blood thinners. 4:45PM patient without recurring epistaxis. Patient stating that he has ENT appointment in 2 days and is ready for discharge. Educated patient on  how to resolve epistaxis with the afrin spray that he received during last ED visit. Patient verbalized understanding of plan. I have reviewed the patients home medicines and have made adjustments as needed Patient afebrile with stable vitals.  Provided return precautions.  Discharged in good condition.   Social Determinants of Health:  none          Final Clinical Impression(s) / ED Diagnoses Final diagnoses:  Left-sided epistaxis    Rx / DC Orders ED Discharge Orders     None         Dorthy Cooler, [place] 01/17/23 1656    Anders Simmonds T, DO 01/18/23 1259

## 2023-01-19 ENCOUNTER — Inpatient Hospital Stay (INDEPENDENT_AMBULATORY_CARE_PROVIDER_SITE_OTHER): Payer: Medicaid Other | Admitting: Primary Care

## 2023-01-19 ENCOUNTER — Encounter (INDEPENDENT_AMBULATORY_CARE_PROVIDER_SITE_OTHER): Payer: Self-pay | Admitting: Primary Care

## 2023-01-19 VITALS — BP 168/117 | HR 97 | Resp 16 | Ht 66.0 in | Wt 251.2 lb

## 2023-01-19 DIAGNOSIS — Z7689 Persons encountering health services in other specified circumstances: Secondary | ICD-10-CM | POA: Diagnosis not present

## 2023-01-19 DIAGNOSIS — R040 Epistaxis: Secondary | ICD-10-CM | POA: Diagnosis not present

## 2023-01-19 DIAGNOSIS — R030 Elevated blood-pressure reading, without diagnosis of hypertension: Secondary | ICD-10-CM | POA: Diagnosis not present

## 2023-01-19 DIAGNOSIS — Z09 Encounter for follow-up examination after completed treatment for conditions other than malignant neoplasm: Secondary | ICD-10-CM | POA: Diagnosis not present

## 2023-01-19 DIAGNOSIS — I10 Essential (primary) hypertension: Secondary | ICD-10-CM | POA: Insufficient documentation

## 2023-01-19 MED ORDER — HYDROCHLOROTHIAZIDE 25 MG PO TABS
25.0000 mg | ORAL_TABLET | Freq: Every day | ORAL | 3 refills | Status: DC
Start: 2023-01-19 — End: 2023-04-21

## 2023-01-19 MED ORDER — AMLODIPINE BESYLATE 10 MG PO TABS
10.0000 mg | ORAL_TABLET | Freq: Every day | ORAL | 1 refills | Status: AC
Start: 2023-01-19 — End: ?

## 2023-01-19 MED ORDER — CLONIDINE HCL 0.1 MG PO TABS
0.1000 mg | ORAL_TABLET | Freq: Once | ORAL | Status: AC
  Administered 2023-01-19: 0.1 mg via ORAL

## 2023-01-19 NOTE — Patient Instructions (Signed)
Calorie Counting for Weight Loss Calories are units of energy. Your body needs a certain number of calories from food to keep going throughout the day. When you eat or drink more calories than your body needs, your body stores the extra calories mostly as fat. When you eat or drink fewer calories than your body needs, your body burns fat to get the energy it needs. Calorie counting means keeping track of how many calories you eat and drink each day. Calorie counting can be helpful if you need to lose weight. If you eat fewer calories than your body needs, you should lose weight. Ask your health care provider what a healthy weight is for you. For calorie counting to work, you will need to eat the right number of calories each day to lose a healthy amount of weight per week. A dietitian can help you figure out how many calories you need in a day and will suggest ways to reach your calorie goal. A healthy amount of weight to lose each week is usually 1-2 lb (0.5-0.9 kg). This usually means that your daily calorie intake should be reduced by 500-750 calories. Eating 1,200-1,500 calories a day can help most women lose weight. Eating 1,500-1,800 calories a day can help most men lose weight. What do I need to know about calorie counting? Work with your health care provider or dietitian to determine how many calories you should get each day. To meet your daily calorie goal, you will need to: Find out how many calories are in each food that you would like to eat. Try to do this before you eat. Decide how much of the food you plan to eat. Keep a food log. Do this by writing down what you ate and how many calories it had. To successfully lose weight, it is important to balance calorie counting with a healthy lifestyle that includes regular activity. Where do I find calorie information?  The number of calories in a food can be found on a Nutrition Facts label. If a food does not have a Nutrition Facts label, try  to look up the calories online or ask your dietitian for help. Remember that calories are listed per serving. If you choose to have more than one serving of a food, you will have to multiply the calories per serving by the number of servings you plan to eat. For example, the label on a package of bread might say that a serving size is 1 slice and that there are 90 calories in a serving. If you eat 1 slice, you will have eaten 90 calories. If you eat 2 slices, you will have eaten 180 calories. How do I keep a food log? After each time that you eat, record the following in your food log as soon as possible: What you ate. Be sure to include toppings, sauces, and other extras on the food. How much you ate. This can be measured in cups, ounces, or number of items. How many calories were in each food and drink. The total number of calories in the food you ate. Keep your food log near you, such as in a pocket-sized notebook or on an app or website on your mobile phone. Some programs will calculate calories for you and show you how many calories you have left to meet your daily goal. What are some portion-control tips? Know how many calories are in a serving. This will help you know how many servings you can have of a certain  food. Use a measuring cup to measure serving sizes. You could also try weighing out portions on a kitchen scale. With time, you will be able to estimate serving sizes for some foods. Take time to put servings of different foods on your favorite plates or in your favorite bowls and cups so you know what a serving looks like. Try not to eat straight from a food's packaging, such as from a bag or box. Eating straight from the package makes it hard to see how much you are eating and can lead to overeating. Put the amount you would like to eat in a cup or on a plate to make sure you are eating the right portion. Use smaller plates, glasses, and bowls for smaller portions and to prevent  overeating. Try not to multitask. For example, avoid watching TV or using your computer while eating. If it is time to eat, sit down at a table and enjoy your food. This will help you recognize when you are full. It will also help you be more mindful of what and how much you are eating. What are tips for following this plan? Reading food labels Check the calorie count compared with the serving size. The serving size may be smaller than what you are used to eating. Check the source of the calories. Try to choose foods that are high in protein, fiber, and vitamins, and low in saturated fat, trans fat, and sodium. Shopping Read nutrition labels while you shop. This will help you make healthy decisions about which foods to buy. Pay attention to nutrition labels for low-fat or fat-free foods. These foods sometimes have the same number of calories or more calories than the full-fat versions. They also often have added sugar, starch, or salt to make up for flavor that was removed with the fat. Make a grocery list of lower-calorie foods and stick to it. Cooking Try to cook your favorite foods in a healthier way. For example, try baking instead of frying. Use low-fat dairy products. Meal planning Use more fruits and vegetables. One-half of your plate should be fruits and vegetables. Include lean proteins, such as chicken, [place], and fish. Lifestyle Each week, aim to do one of the following: 150 minutes of moderate exercise, such as walking. 75 minutes of vigorous exercise, such as running. General information Know how many calories are in the foods you eat most often. This will help you calculate calorie counts faster. Find a way of tracking calories that works for you. Get creative. Try different apps or programs if writing down calories does not work for you. What foods should I eat?  Eat nutritious foods. It is better to have a nutritious, high-calorie food, such as an avocado, than a food with  few nutrients, such as a bag of potato chips. Use your calories on foods and drinks that will fill you up and will not leave you hungry soon after eating. Examples of foods that fill you up are nuts and nut butters, vegetables, lean proteins, and high-fiber foods such as whole grains. High-fiber foods are foods with more than 5 g of fiber per serving. Pay attention to calories in drinks. Low-calorie drinks include water and unsweetened drinks. The items listed above may not be a complete list of foods and beverages you can eat. Contact a dietitian for more information. What foods should I limit? Limit foods or drinks that are not good sources of vitamins, minerals, or protein or that are high in unhealthy fats. These  include: Candy. Other sweets. Sodas, specialty coffee drinks, alcohol, and juice. The items listed above may not be a complete list of foods and beverages you should avoid. Contact a dietitian for more information. How do I count calories when eating out? Pay attention to portions. Often, portions are much larger when eating out. Try these tips to keep portions smaller: Consider sharing a meal instead of getting your own. If you get your own meal, eat only half of it. Before you start eating, ask for a container and put half of your meal into it. When available, consider ordering smaller portions from the menu instead of full portions. Pay attention to your food and drink choices. Knowing the way food is cooked and what is included with the meal can help you eat fewer calories. If calories are listed on the menu, choose the lower-calorie options. Choose dishes that include vegetables, fruits, whole grains, low-fat dairy products, and lean proteins. Choose items that are boiled, broiled, grilled, or steamed. Avoid items that are buttered, battered, fried, or served with cream sauce. Items labeled as crispy are usually fried, unless stated otherwise. Choose water, low-fat milk,  unsweetened iced tea, or other drinks without added sugar. If you want an alcoholic beverage, choose a lower-calorie option, such as a glass of wine or light beer. Ask for dressings, sauces, and syrups on the side. These are usually high in calories, so you should limit the amount you eat. If you want a salad, choose a garden salad and ask for grilled meats. Avoid extra toppings such as bacon, cheese, or fried items. Ask for the dressing on the side, or ask for olive oil and vinegar or lemon to use as dressing. Estimate how many servings of a food you are given. Knowing serving sizes will help you be aware of how much food you are eating at restaurants. Where to find more information Centers for Disease Control and Prevention: FootballExhibition.com.br [place]. Department of Agriculture: WrestlingReporter.dk Summary Calorie counting means keeping track of how many calories you eat and drink each day. If you eat fewer calories than your body needs, you should lose weight. A healthy amount of weight to lose per week is usually 1-2 lb (0.5-0.9 kg). This usually means reducing your daily calorie intake by 500-750 calories. The number of calories in a food can be found on a Nutrition Facts label. If a food does not have a Nutrition Facts label, try to look up the calories online or ask your dietitian for help. Use smaller plates, glasses, and bowls for smaller portions and to prevent overeating. Use your calories on foods and drinks that will fill you up and not leave you hungry shortly after a meal. This information is not intended to replace advice given to you by your health care provider. Make sure you discuss any questions you have with your health care provider. Document Revised: 06/30/2019 Document Reviewed: 06/30/2019 Elsevier Patient Education  2023 Else[employer] Inc. Hypertension, Adult Hypertension is another name for high blood pressure. High blood pressure forces your heart to work harder to pump blood. This can cause  problems over time. There are two numbers in a blood pressure reading. There is a top number (systolic) over a bottom number (diastolic). It is best to have a blood pressure that is below 120/80. What are the causes? The cause of this condition is not known. Some other conditions can lead to high blood pressure. What increases the risk? Some lifestyle factors can make you more likely  to develop high blood pressure: Smoking. Not getting enough exercise or physical activity. Being overweight. Having too much fat, sugar, calories, or salt (sodium) in your diet. Drinking too much alcohol. Other risk factors include: Having any of these conditions: Heart disease. Diabetes. High cholesterol. Kidney disease. Obstructive sleep apnea. Having a family history of high blood pressure and high cholesterol. Age. The risk increases with age. Stress. What are the signs or symptoms? High blood pressure may not cause symptoms. Very high blood pressure (hypertensive crisis) may cause: Headache. Fast or uneven heartbeats (palpitations). Shortness of breath. Nosebleed. Vomiting or feeling like you may vomit (nauseous). Changes in how you see. Very bad chest pain. Feeling dizzy. Seizures. How is this treated? This condition is treated by making healthy lifestyle changes, such as: Eating healthy foods. Exercising more. Drinking less alcohol. Your doctor may prescribe medicine if lifestyle changes do not help enough and if: Your top number is above 130. Your bottom number is above 80. Your personal target blood pressure may vary. Follow these instructions at home: Eating and drinking  If told, follow the DASH eating plan. To follow this plan: Fill one half of your plate at each meal with fruits and vegetables. Fill one fourth of your plate at each meal with whole grains. Whole grains include whole-wheat pasta, brown rice, and whole-grain bread. Eat or drink low-fat dairy products, such as skim  milk or low-fat yogurt. Fill one fourth of your plate at each meal with low-fat (lean) proteins. Low-fat proteins include fish, chicken without skin, eggs, beans, and tofu. Avoid fatty meat, cured and processed meat, or chicken with skin. Avoid pre-made or processed food. Limit the amount of salt in your diet to less than 1,500 mg each day. Do not drink alcohol if: Your doctor tells you not to drink. You are pregnant, may be pregnant, or are planning to become pregnant. If you drink alcohol: Limit how much you have to: 0-1 drink a day for women. 0-2 drinks a day for men. Know how much alcohol is in your drink. In the [place] one drink equals one 12 oz bottle of beer (355 mL), one 5 oz glass of wine (148 mL), or one 1 oz glass of hard liquor (44 mL). Lifestyle  Work with your doctor to stay at a healthy weight or to lose weight. Ask your doctor what the best weight is for you. Get at least 30 minutes of exercise that causes your heart to beat faster (aerobic exercise) most days of the week. This may include walking, swimming, or biking. Get at least 30 minutes of exercise that strengthens your muscles (resistance exercise) at least 3 days a week. This may include lifting weights or doing Pilates. Do not smoke or use any products that contain nicotine or tobacco. If you need help quitting, ask your doctor. Check your blood pressure at home as told by your doctor. Keep all follow-up visits. Medicines Take over-the-counter and prescription medicines only as told by your doctor. Follow directions carefully. Do not skip doses of blood pressure medicine. The medicine does not work as well if you skip doses. Skipping doses also puts you at risk for problems. Ask your doctor about side effects or reactions to medicines that you should watch for. Contact a doctor if: You think you are having a reaction to the medicine you are taking. You have headaches that keep coming back. You feel dizzy. You  have swelling in your ankles. You have trouble with your vision. Get  help right away if: You get a very bad headache. You start to feel mixed up (confused). You feel weak or numb. You feel faint. You have very bad pain in your: Chest. Belly (abdomen). You vomit more than once. You have trouble breathing. These symptoms may be an emergency. Get help right away. Call 911. Do not wait to see if the symptoms will go away. Do not drive yourself to the hospital. Summary Hypertension is another name for high blood pressure. High blood pressure forces your heart to work harder to pump blood. For most people, a normal blood pressure is less than 120/80. Making healthy choices can help lower blood pressure. If your blood pressure does not get lower with healthy choices, you may need to take medicine. This information is not intended to replace advice given to you by your health care provider. Make sure you discuss any questions you have with your health care provider. Document Revised: 03/07/2021 Document Reviewed: 03/07/2021 Elsevier Patient Education  2024 [employer].

## 2023-01-19 NOTE — Progress Notes (Signed)
[facility]   Subjective:   Craig Silva is a 32 y.o. male presents for hospital follow up and establish care. Presented to the ED on 01/17/23  for nose bleed which he had been having frequently discharged for Left-sided epistaxis.Patient was using afrin daily explained it is a 3 day use and elevated blood pressure maybe underlying causes. Discharged on amlodipine 5mg  .  Patient has No headache, No chest pain, No abdominal pain - No Nausea, No new weakness tingling or numbness, No Cough - shortness of breath . Past Medical History:  Diagnosis Date   COVID-19    Hypertension      No Known Allergies    Current Outpatient Medications on File Prior to Visit  Medication Sig Dispense Refill   amLODipine (NORVASC) 5 MG tablet Take 1 tablet (5 mg total) by mouth daily. 30 tablet 0   oxymetazoline (AFRIN NASAL SPRAY) 0.05 % nasal spray Place 1 spray into both nostrils as needed for up to 14 days for congestion (For nose bleeds). 30 mL 0   aspirin EC 81 MG EC tablet Take 1 tablet (81 mg total) by mouth daily. (Patient not taking: Reported on 01/09/2023) 30 tablet 0   ondansetron (ZOFRAN) 4 MG tablet Take 1 tablet (4 mg total) by mouth every 6 (six) hours. (Patient not taking: Reported on 05/28/2019) 12 tablet 0   ondansetron (ZOFRAN-ODT) 4 MG disintegrating tablet Take 1 tablet (4 mg total) by mouth every 8 (eight) hours as needed for nausea or vomiting. (Patient not taking: Reported on 01/09/2023) 20 tablet 0   thiamine 100 MG tablet Take 1 tablet (100 mg total) by mouth daily. (Patient not taking: Reported on 01/09/2023) 30 tablet 0   No current facility-administered medications on file prior to visit.     Review of System: Comprehensive ROS Pertinent positive and negative noted in HPI   Objective:  Blood Pressure (Abnormal) 168/117 (BP Location: Left Arm, Patient Position: Sitting, Cuff Size: Large)   Pulse 97   Respiration 16   Height 5\' 6"  (1.676 m)   Weight 251 lb 3.2 oz  (113.9 kg)   Oxygen Saturation 100%   Body Mass Index 40.54 kg/m   Filed Weights   01/19/23 1023  Weight: 251 lb 3.2 oz (113.9 kg)    Physical Exam: General Appearance: Well nourished, morbid obese  in no apparent distress. Eyes: PERRLA, EOMs, conjunctiva no swelling or erythema Sinuses: No Frontal/maxillary tenderness ENT/Mouth: Ext aud canals clear, TMs without erythema, bulging. No erythema, swelling, or exudate on post pharynx.  Tonsils not swollen or erythematous. Hearing normal.  Neck: Supple, thyroid normal.  Respiratory: Respiratory effort normal, BS equal bilaterally without rales, rhonchi, wheezing or stridor.  Cardio: RRR with no MRGs. Brisk peripheral pulses without edema.  Abdomen: Soft, + BS.  Non tender, no guarding, rebound, hernias, masses. Lymphatics: Non tender without lymphadenopathy.  Musculoskeletal: Full ROM, 5/5 strength, normal gait.  Skin: Warm, dry without rashes, lesions, ecchymosis.  Neuro: Cranial nerves intact. Normal muscle tone, no cerebellar symptoms. Sensation intact.  Psych: Awake and oriented X 3, normal affect, Insight and Judgment appropriate.    Assessment:  Craig Silva was seen today for hospitalization follow-up.  Diagnoses and all orders for this visit:  Elevated blood pressure reading -     cloNIDine (CATAPRES) tablet 0.1 mg  Essential hypertension BP goal - < 130/80 Explained that having normal blood pressure is the goal and medications are helping to get to goal and maintain normal blood pressure.  DIET: Limit salt intake, read nutrition labels to check salt content, limit fried and high fatty foods  Avoid using multisymptom OTC cold preparations that generally contain sudafed which can rise BP. Consult with pharmacist on best cold relief products to use for persons with HTN EXERCISE Discussed incorporating exercise such as walking - 30 minutes most days of the week and can do in 10 minute intervals    -     amLODipine (NORVASC) 10 MG  tablet; Take 1 tablet (10 mg total) by mouth daily. -     hydrochlorothiazide (HYDRODIURIL) 25 MG tablet; Take 1 tablet (25 mg total) by mouth daily.  Morbidly obese (HCC) Discussed diet and exercise for person with BMI >25. Instructed: You must burn more calories than you eat. Losing 5 percent of your body weight should be considered a success. In the longer term, losing more than 15 percent of your body weight and staying at this weight is an extremely good result. However, keep in mind that even losing 5 percent of your body weight leads to important health benefits, so try not to get discouraged if you're not able to lose more than this. Will recheck weight in 3-6 months.   Hospital discharge follow-up 2/2 Encounter to establish care Follow up with [facility]; establish primary care     This note has been created with Education officer, environmental. Any transcriptional errors are unintentional.   Grayce Sessions, NP 01/19/2023, 11:26 AM

## 2023-02-06 DIAGNOSIS — N1831 Chronic kidney disease, stage 3a: Secondary | ICD-10-CM | POA: Diagnosis not present

## 2023-02-06 DIAGNOSIS — R809 Proteinuria, unspecified: Secondary | ICD-10-CM | POA: Diagnosis not present

## 2023-02-09 ENCOUNTER — Other Ambulatory Visit: Payer: Self-pay | Admitting: Nephrology

## 2023-02-09 ENCOUNTER — Ambulatory Visit (INDEPENDENT_AMBULATORY_CARE_PROVIDER_SITE_OTHER): Payer: Medicaid Other

## 2023-02-09 DIAGNOSIS — N1831 Chronic kidney disease, stage 3a: Secondary | ICD-10-CM

## 2023-02-10 DIAGNOSIS — R040 Epistaxis: Secondary | ICD-10-CM | POA: Diagnosis not present

## 2023-02-10 DIAGNOSIS — I10 Essential (primary) hypertension: Secondary | ICD-10-CM | POA: Diagnosis not present

## 2023-02-10 DIAGNOSIS — Z6841 Body Mass Index (BMI) 40.0 and over, adult: Secondary | ICD-10-CM | POA: Diagnosis not present

## 2023-02-11 ENCOUNTER — Ambulatory Visit (INDEPENDENT_AMBULATORY_CARE_PROVIDER_SITE_OTHER): Payer: Medicaid Other

## 2023-02-11 VITALS — BP 124/87

## 2023-02-11 DIAGNOSIS — Z0130 Encounter for examination of blood pressure without abnormal findings: Secondary | ICD-10-CM

## 2023-02-12 ENCOUNTER — Ambulatory Visit
Admission: RE | Admit: 2023-02-12 | Discharge: 2023-02-12 | Disposition: A | Discharge status: Home / Self Care | Payer: Medicaid Other | Source: Ambulatory Visit | Attending: Nephrology | Admitting: Nephrology

## 2023-02-12 DIAGNOSIS — N1831 Chronic kidney disease, stage 3a: Secondary | ICD-10-CM

## 2023-04-16 ENCOUNTER — Ambulatory Visit (INDEPENDENT_AMBULATORY_CARE_PROVIDER_SITE_OTHER): Payer: Medicaid Other | Admitting: Primary Care

## 2023-04-21 ENCOUNTER — Ambulatory Visit (INDEPENDENT_AMBULATORY_CARE_PROVIDER_SITE_OTHER): Payer: Medicaid Other | Admitting: Primary Care

## 2023-04-21 ENCOUNTER — Encounter (INDEPENDENT_AMBULATORY_CARE_PROVIDER_SITE_OTHER): Payer: Self-pay | Admitting: Primary Care

## 2023-04-21 VITALS — BP 164/107 | HR 113 | Resp 16 | Wt 248.2 lb

## 2023-04-21 DIAGNOSIS — I10 Essential (primary) hypertension: Secondary | ICD-10-CM

## 2023-04-21 DIAGNOSIS — Z28310 Unvaccinated for covid-19: Secondary | ICD-10-CM | POA: Diagnosis not present

## 2023-04-21 DIAGNOSIS — Z2821 Immunization not carried out because of patient refusal: Secondary | ICD-10-CM | POA: Diagnosis not present

## 2023-04-21 DIAGNOSIS — D649 Anemia, unspecified: Secondary | ICD-10-CM

## 2023-04-21 DIAGNOSIS — E66813 Obesity, class 3: Secondary | ICD-10-CM | POA: Diagnosis not present

## 2023-04-21 DIAGNOSIS — Z6841 Body Mass Index (BMI) 40.0 and over, adult: Secondary | ICD-10-CM

## 2023-04-21 DIAGNOSIS — Z833 Family history of diabetes mellitus: Secondary | ICD-10-CM

## 2023-04-21 DIAGNOSIS — R7309 Other abnormal glucose: Secondary | ICD-10-CM | POA: Diagnosis not present

## 2023-04-21 DIAGNOSIS — Z1159 Encounter for screening for other viral diseases: Secondary | ICD-10-CM | POA: Diagnosis not present

## 2023-04-21 MED ORDER — HYDROCHLOROTHIAZIDE 25 MG PO TABS: 25.0000 mg | ORAL_TABLET | Freq: Every day | ORAL | 1 refills | Status: AC

## 2023-04-21 NOTE — Progress Notes (Signed)
Established Patient Office Visit  Subjective   Patient ID: Craig Silva    DOB: 1990/06/07  Age: 32 y.o. MRN: [id number]  Hypertension This is a chronic problem. The current episode started more than 1 year ago. The problem has been rapidly worsening since onset. The problem is uncontrolled. Associated symptoms include headaches and shortness of breath. Risk factors for coronary artery disease include diabetes mellitus, male gender and obesity. Past treatments include calcium channel blockers and diuretics. The current treatment provides moderate improvement.  Epistaxis  The bleeding has been from the left nare. The current episode started more than 1 month ago. The problem has been waxing and waning. He has tried pressure for the symptoms. The treatment provided mild relief.     HPI     Hypertension    Additional comments: Ran out of meds on Sunday       Last edited by Herbert Deaner, RMA on 04/21/2023  9:38 AM.      Active Ambulatory Problems    Diagnosis Date Noted   Chest pain 05/28/2019   Elevated blood pressure reading 05/28/2019   Essential hypertension 01/19/2023   Morbid obesity (HCC) 01/19/2023   Epistaxis 01/19/2023   Resolved Ambulatory Problems    Diagnosis Date Noted   No Resolved Ambulatory Problems   Past Medical History:  Diagnosis Date   COVID-19    Hypertension      Review of Systems  HENT:  Positive for nosebleeds.   Respiratory:  Positive for shortness of breath.   Neurological:  Positive for headaches.       Objective:   BP (!) 164/107 (BP Location: Left Arm, Patient Position: Sitting, Cuff Size: Large)   Pulse (!) 113   Resp 16   Wt 248 lb 3.2 oz (112.6 kg)   SpO2 98%   BMI 40.06 kg/m    General: No apparent distress.  Morbid obese male Eyes: Extraocular eye movements intact, pupils equal and round. Neck: Supple, trachea midline. Thyroid: No enlargement, mobile without fixation, no tenderness. Cardiovascular: Regular rhythm  and rate, no murmur, normal radial pulses. Respiratory: Normal respiratory effort, clear to auscultation. Gastrointestinal: Normal pitch active bowel sounds, nontender abdomen without distention or appreciable hepatomegaly. Musculoskeletal: Normal muscle tone, no tenderness on palpation of tibia, no excessive thoracic kyphosis. Skin: Appropriate warmth, no visible rash. Mental status: Alert, conversant, speech clear, thought logical, appropriate mood and affect, no hallucinations or delusions evident. Hematologic/lymphatic: No cervical adenopathy, no visible ecchymoses.  No results found for any visits on 04/10/22.  The ASCVD Risk score (Arnett DK, et al., 2019) failed to calculate for the following reasons:   The systolic blood pressure is missing   Cannot find a previous HDL lab   Cannot find a previous total cholesterol lab    Assessment & Plan:  Ledell was seen today for hypertension.  Diagnoses and all orders for this visit:  Influenza vaccination declined  Essential hypertension BP goal - < 130/80 Explained that having normal blood pressure is the goal and medications are helping to get to goal and maintain normal blood pressure. DIET: Limit salt intake, read nutrition labels to check salt content, limit fried and high fatty foods  Avoid using multisymptom OTC cold preparations that generally contain sudafed which can rise BP. Consult with pharmacist on best cold relief products to use for persons with HTN EXERCISE Discussed incorporating exercise such as walking - 30 minutes most days of the week and can do in 10 minute intervals    -  hydrochlorothiazide (HYDRODIURIL) 25 MG tablet; Take 1 tablet (25 mg total) by mouth daily. -     CMP14+EGFR  COVID-19 vaccine series declined  Anemia, unspecified type -     CBC with Differential/Platelet -     Iron, TIBC and Ferritin Panel  Morbidly obese (HCC) -     Lipid panel -     Hemoglobin A1c  Family history of diabetes  mellitus -     Hemoglobin A1c  Encounter for HCV screening test for low risk patient -     HCV Ab w Reflex to Quant PCR  Other orders -     Interpretation:     Grayce Sessions, NP

## 2023-04-22 LAB — HCV INTERPRETATION

## 2023-04-22 LAB — LIPID PANEL
Chol/HDL Ratio: 7.3 ratio — ABNORMAL HIGH (ref 0.0–5.0)
Cholesterol, Total: 301 mg/dL — ABNORMAL HIGH (ref 100–199)
HDL: 41 mg/dL (ref >39)
LDL Chol Calc (NIH): 224 mg/dL — ABNORMAL HIGH (ref 0–99)
Triglycerides: 183 mg/dL — ABNORMAL HIGH (ref 0–149)
VLDL Cholesterol Cal: 36 mg/dL (ref 5–40)

## 2023-04-22 LAB — CBC WITH DIFFERENTIAL/PLATELET
Basophils Absolute: 0 10*3/uL (ref 0.0–0.2)
Basos: 1 %
EOS (ABSOLUTE): 0 10*3/uL (ref 0.0–0.4)
Eos: 0 %
Hematocrit: 42.9 % (ref 37.5–51.0)
Hemoglobin: 13.8 g/dL (ref 13.0–17.7)
Immature Grans (Abs): 0 10*3/uL (ref 0.0–0.1)
Immature Granulocytes: 0 %
Lymphocytes Absolute: 1.8 10*3/uL (ref 0.7–3.1)
Lymphs: 22 %
MCH: 27.1 pg (ref 26.6–33.0)
MCHC: 32.2 g/dL (ref 31.5–35.7)
MCV: 84 fL (ref 79–97)
Monocytes Absolute: 0.5 10*3/uL (ref 0.1–0.9)
Monocytes: 6 %
Neutrophils Absolute: 5.9 10*3/uL (ref 1.4–7.0)
Neutrophils: 71 %
Platelets: 289 10*3/uL (ref 150–450)
RBC: 5.1 x10E6/uL (ref 4.14–5.80)
RDW: 14.4 % (ref 11.6–15.4)
WBC: 8.2 10*3/uL (ref 3.4–10.8)

## 2023-04-22 LAB — CMP14+EGFR
ALT: 16 [IU]/L (ref 0–44)
AST: 26 [IU]/L (ref 0–40)
Albumin: 4.5 g/dL (ref 4.1–5.1)
Alkaline Phosphatase: 71 [IU]/L (ref 44–121)
BUN/Creatinine Ratio: 11 (ref 9–20)
BUN: 14 mg/dL (ref 6–20)
Bilirubin Total: <0.2 mg/dL (ref 0.0–1.2)
CO2: 21 mmol/L (ref 20–29)
Calcium: 10.7 mg/dL — ABNORMAL HIGH (ref 8.7–10.2)
Chloride: 100 mmol/L (ref 96–106)
Creatinine, Ser: 1.22 mg/dL (ref 0.76–1.27)
Globulin, Total: 3.3 g/dL (ref 1.5–4.5)
Glucose: 92 mg/dL (ref 70–99)
Potassium: 4.5 mmol/L (ref 3.5–5.2)
Sodium: 138 mmol/L (ref 134–144)
Total Protein: 7.8 g/dL (ref 6.0–8.5)
eGFR: 80 mL/min/{1.73_m2} (ref >59)

## 2023-04-22 LAB — IRON,TIBC AND FERRITIN PANEL
Ferritin: 40 ng/mL (ref 30–400)
Iron Saturation: 12 % — ABNORMAL LOW (ref 15–55)
Iron: 48 ug/dL (ref 38–169)
Total Iron Binding Capacity: 399 ug/dL (ref 250–450)
UIBC: 351 ug/dL — ABNORMAL HIGH (ref 111–343)

## 2023-04-22 LAB — HCV AB W REFLEX TO QUANT PCR: HCV Ab: NONREACTIVE

## 2023-04-22 LAB — HEMOGLOBIN A1C
Est. average glucose Bld gHb Est-mCnc: 123 mg/dL
Hgb A1c MFr Bld: 5.9 % — ABNORMAL HIGH (ref 4.8–5.6)

## 2023-04-27 ENCOUNTER — Other Ambulatory Visit (INDEPENDENT_AMBULATORY_CARE_PROVIDER_SITE_OTHER): Payer: Self-pay | Admitting: Primary Care

## 2023-04-27 DIAGNOSIS — E782 Mixed hyperlipidemia: Secondary | ICD-10-CM

## 2023-04-27 MED ORDER — ROSUVASTATIN CALCIUM 40 MG PO TABS: 40.0000 mg | ORAL_TABLET | Freq: Every day | ORAL | 3 refills | Status: DC

## 2023-04-27 NOTE — Telephone Encounter (Signed)
Medication Refill -  Most Recent Primary Care Visit:  Provider: Grayce Sessions  Department: [facility]  Visit Type: OFFICE VISIT  Date: 04/21/2023  Medication: rosuvastatin (CRESTOR) 40 MG tablet   Pt is requesting medication be transferred to pharmacy below.   Has the patient contacted their pharmacy? No (Agent: If no, request that the patient contact the pharmacy for the refill. If patient does not wish to contact the pharmacy document the reason why and proceed with request.)   Is this the correct pharmacy for this prescription? Yes If no, delete pharmacy and type the correct one.  This is the patient's preferred pharmacy:   [facility] [phone] - [place], [place] - [address] AT [facility] [address] [place] [address] Phone: [phone] Fax: [phone]   Has the prescription been filled recently? Yes  Is the patient out of the medication? Yes  Has the patient been seen for an appointment in the last year OR does the patient have an upcoming appointment? Yes  Can we respond through MyChart? Yes  Agent: Please be advised that Rx refills may take up to 3 business days. We ask that you follow-up with your pharmacy.

## 2023-04-28 MED ORDER — ROSUVASTATIN CALCIUM 40 MG PO TABS: 40.0000 mg | ORAL_TABLET | Freq: Every day | ORAL | 3 refills | Status: AC

## 2023-04-28 NOTE — Telephone Encounter (Signed)
Transferring to [facility]  Requested Prescriptions  Pending Prescriptions Disp Refills   rosuvastatin (CRESTOR) 40 MG tablet 90 tablet 3    Sig: Take 1 tablet (40 mg total) by mouth daily.     Cardiovascular:  Antilipid - Statins 2 Failed - 04/27/2023  5:58 PM      Failed - Lipid Panel in normal range within the last 12 months    Cholesterol, Total  Date Value Ref Range Status  04/21/2023 301 (H) 100 - 199 mg/dL Final   LDL Chol Calc (NIH)  Date Value Ref Range Status  04/21/2023 224 (H) 0 - 99 mg/dL Final   HDL  Date Value Ref Range Status  04/21/2023 41 >39 mg/dL Final   Triglycerides  Date Value Ref Range Status  04/21/2023 183 (H) 0 - 149 mg/dL Final         Passed - Cr in normal range and within 360 days    Creatinine, Ser  Date Value Ref Range Status  04/21/2023 1.22 0.76 - 1.27 mg/dL Final         Passed - Patient is not pregnant      Passed - Valid encounter within last 12 months    Recent Outpatient Visits           1 week ago Influenza vaccination declined   [place] [facility] Grayce Sessions, NP   3 months ago Elevated blood pressure reading   [place] [facility] Grayce Sessions, NP       Future Appointments             In 2 months Randa Evens, Kinnie Scales, NP [facility]

## 2023-05-05 ENCOUNTER — Encounter (INDEPENDENT_AMBULATORY_CARE_PROVIDER_SITE_OTHER): Payer: Self-pay

## 2023-05-05 ENCOUNTER — Ambulatory Visit (INDEPENDENT_AMBULATORY_CARE_PROVIDER_SITE_OTHER): Payer: Medicaid Other

## 2023-05-08 DIAGNOSIS — N1831 Chronic kidney disease, stage 3a: Secondary | ICD-10-CM | POA: Diagnosis not present

## 2023-06-08 ENCOUNTER — Ambulatory Visit (INDEPENDENT_AMBULATORY_CARE_PROVIDER_SITE_OTHER): Payer: Medicaid Other

## 2023-07-16 ENCOUNTER — Telehealth (INDEPENDENT_AMBULATORY_CARE_PROVIDER_SITE_OTHER): Payer: Self-pay | Admitting: Primary Care

## 2023-07-16 NOTE — Telephone Encounter (Signed)
Called to remind pt about atp. Pt will be present.

## 2023-07-21 ENCOUNTER — Telehealth (INDEPENDENT_AMBULATORY_CARE_PROVIDER_SITE_OTHER): Payer: Self-pay | Admitting: Primary Care

## 2023-07-21 NOTE — Telephone Encounter (Signed)
Called pt and was able to schedule another atp due to weather.

## 2023-07-23 ENCOUNTER — Ambulatory Visit (INDEPENDENT_AMBULATORY_CARE_PROVIDER_SITE_OTHER): Payer: Self-pay | Admitting: Primary Care

## 2023-07-27 ENCOUNTER — Telehealth (INDEPENDENT_AMBULATORY_CARE_PROVIDER_SITE_OTHER): Payer: Self-pay | Admitting: Primary Care

## 2023-07-27 ENCOUNTER — Ambulatory Visit (INDEPENDENT_AMBULATORY_CARE_PROVIDER_SITE_OTHER): Payer: Medicaid Other | Admitting: Primary Care

## 2023-07-27 NOTE — Telephone Encounter (Signed)
 Called pt to see if interested in rescheduling. Pt did not answer and could not leave VM.
